# Patient Record
Sex: Female | Born: 1965 | Race: White | Hispanic: No | Marital: Married | Smoking: Never smoker
Health system: Southern US, Community
[De-identification: ages and names within clinical notes are randomized; demographics above are authoritative.]

## PROBLEM LIST (undated history)

## (undated) DIAGNOSIS — E041 Nontoxic single thyroid nodule: Secondary | ICD-10-CM

## (undated) HISTORY — PX: DILATION AND CURETTAGE OF UTERUS: SHX78

## (undated) HISTORY — DX: Nontoxic single thyroid nodule: E04.1

## (undated) HISTORY — PX: TUBAL LIGATION: SHX77

---

## 1998-01-15 ENCOUNTER — Other Ambulatory Visit: Admission: RE | Admit: 1998-01-15 | Discharge: 1998-01-15 | Payer: Self-pay | Admitting: Gynecology

## 1998-01-29 ENCOUNTER — Encounter: Admission: RE | Admit: 1998-01-29 | Discharge: 1998-04-29 | Payer: Self-pay | Admitting: Gynecology

## 1998-08-16 ENCOUNTER — Inpatient Hospital Stay (HOSPITAL_COMMUNITY): Admission: AD | Admit: 1998-08-16 | Discharge: 1998-08-18 | Payer: Self-pay | Admitting: Gynecology

## 1998-09-23 ENCOUNTER — Other Ambulatory Visit: Admission: RE | Admit: 1998-09-23 | Discharge: 1998-09-23 | Payer: Self-pay | Admitting: Gynecology

## 1999-03-04 ENCOUNTER — Other Ambulatory Visit: Admission: RE | Admit: 1999-03-04 | Discharge: 1999-03-04 | Payer: Self-pay | Admitting: Gynecology

## 1999-03-07 ENCOUNTER — Ambulatory Visit (HOSPITAL_COMMUNITY): Admission: RE | Admit: 1999-03-07 | Discharge: 1999-03-07 | Payer: Self-pay | Admitting: Gynecology

## 1999-03-07 ENCOUNTER — Encounter (INDEPENDENT_AMBULATORY_CARE_PROVIDER_SITE_OTHER): Payer: Self-pay

## 1999-11-11 ENCOUNTER — Other Ambulatory Visit: Admission: RE | Admit: 1999-11-11 | Discharge: 1999-11-11 | Payer: Self-pay | Admitting: Gynecology

## 1999-11-20 ENCOUNTER — Ambulatory Visit (HOSPITAL_COMMUNITY): Admission: RE | Admit: 1999-11-20 | Discharge: 1999-11-20 | Payer: Self-pay | Admitting: Gynecology

## 1999-11-20 ENCOUNTER — Encounter (INDEPENDENT_AMBULATORY_CARE_PROVIDER_SITE_OTHER): Payer: Self-pay | Admitting: Specialist

## 2000-03-26 ENCOUNTER — Encounter: Admission: RE | Admit: 2000-03-26 | Discharge: 2000-06-24 | Payer: Self-pay | Admitting: Gynecology

## 2000-10-08 ENCOUNTER — Inpatient Hospital Stay (HOSPITAL_COMMUNITY): Admission: AD | Admit: 2000-10-08 | Discharge: 2000-10-10 | Payer: Self-pay | Admitting: Gynecology

## 2000-10-08 ENCOUNTER — Encounter (INDEPENDENT_AMBULATORY_CARE_PROVIDER_SITE_OTHER): Payer: Self-pay

## 2000-10-23 ENCOUNTER — Inpatient Hospital Stay (HOSPITAL_COMMUNITY): Admission: AD | Admit: 2000-10-23 | Discharge: 2000-10-23 | Payer: Self-pay | Admitting: Gynecology

## 2000-10-24 ENCOUNTER — Inpatient Hospital Stay (HOSPITAL_COMMUNITY): Admission: AD | Admit: 2000-10-24 | Discharge: 2000-10-24 | Payer: Self-pay | Admitting: Gynecology

## 2000-12-03 ENCOUNTER — Other Ambulatory Visit: Admission: RE | Admit: 2000-12-03 | Discharge: 2000-12-03 | Payer: Self-pay | Admitting: Gynecology

## 2002-03-01 ENCOUNTER — Other Ambulatory Visit: Admission: RE | Admit: 2002-03-01 | Discharge: 2002-03-01 | Payer: Self-pay | Admitting: Gynecology

## 2003-07-04 ENCOUNTER — Other Ambulatory Visit: Admission: RE | Admit: 2003-07-04 | Discharge: 2003-07-04 | Payer: Self-pay | Admitting: Gynecology

## 2004-07-08 ENCOUNTER — Other Ambulatory Visit: Admission: RE | Admit: 2004-07-08 | Discharge: 2004-07-08 | Payer: Self-pay | Admitting: Gynecology

## 2006-06-01 ENCOUNTER — Other Ambulatory Visit: Admission: RE | Admit: 2006-06-01 | Discharge: 2006-06-01 | Payer: Self-pay | Admitting: Gynecology

## 2007-07-19 ENCOUNTER — Other Ambulatory Visit: Admission: RE | Admit: 2007-07-19 | Discharge: 2007-07-19 | Payer: Self-pay | Admitting: Gynecology

## 2008-04-05 ENCOUNTER — Ambulatory Visit: Payer: Self-pay | Admitting: Gynecology

## 2008-04-20 ENCOUNTER — Encounter: Admission: RE | Admit: 2008-04-20 | Discharge: 2008-04-20 | Payer: Self-pay | Admitting: Endocrinology

## 2008-05-03 ENCOUNTER — Other Ambulatory Visit: Admission: RE | Admit: 2008-05-03 | Discharge: 2008-05-03 | Payer: Self-pay | Admitting: Endocrinology

## 2008-08-02 ENCOUNTER — Ambulatory Visit: Payer: Self-pay | Admitting: Gynecology

## 2008-08-02 ENCOUNTER — Other Ambulatory Visit: Admission: RE | Admit: 2008-08-02 | Discharge: 2008-08-02 | Payer: Self-pay | Admitting: Gynecology

## 2008-08-02 ENCOUNTER — Encounter: Payer: Self-pay | Admitting: Gynecology

## 2009-08-13 ENCOUNTER — Ambulatory Visit: Payer: Self-pay | Admitting: Gynecology

## 2009-08-13 ENCOUNTER — Other Ambulatory Visit: Admission: RE | Admit: 2009-08-13 | Discharge: 2009-08-13 | Payer: Self-pay | Admitting: Gynecology

## 2010-08-14 ENCOUNTER — Encounter: Payer: Self-pay | Admitting: Gynecology

## 2010-08-28 ENCOUNTER — Encounter (INDEPENDENT_AMBULATORY_CARE_PROVIDER_SITE_OTHER): Payer: BC Managed Care – PPO | Admitting: Gynecology

## 2010-08-28 ENCOUNTER — Other Ambulatory Visit (HOSPITAL_COMMUNITY)
Admission: RE | Admit: 2010-08-28 | Discharge: 2010-08-28 | Disposition: A | Discharge status: Home / Self Care | Payer: BC Managed Care – PPO | Source: Ambulatory Visit | Attending: Gynecology | Admitting: Gynecology

## 2010-08-28 ENCOUNTER — Other Ambulatory Visit: Payer: Self-pay | Admitting: Gynecology

## 2010-08-28 DIAGNOSIS — Z124 Encounter for screening for malignant neoplasm of cervix: Secondary | ICD-10-CM | POA: Insufficient documentation

## 2010-08-28 DIAGNOSIS — Z01419 Encounter for gynecological examination (general) (routine) without abnormal findings: Secondary | ICD-10-CM

## 2010-11-07 NOTE — Op Note (Signed)
[facility]  Patient:    Angela Rios, Angela Rios                        MRN: [id number] Proc. Date: 10/08/00 Adm. Date:  [id number] Attending:  Merrily Pew                           Operative Report  PREOPERATIVE DIAGNOSES:       1. Pregnancy at term.                               2. Prior cesarean section x 2.  Desires repeat.                               3. Desires permanent sterilization.  POSTOPERATIVE DIAGNOSES:      1. Pregnancy at term.                               2. Prior cesarean section x 2.  Desires repeat.                               3. Desires permanent sterilization.  PROCEDURES:                   1. Repeat low transverse cervical cesarean                                  section.                               2. Bilateral tubal sterilization, modified                                  Pomeroy technique.  SURGEON:                      Timothy P. Fontaine, M.D.  ASSISTANTGaetano Hawthorne. Lily Peer, M.D.  ANESTHESIA:                   Regional.  ESTIMATED BLOOD LOSS:         Less than 500 cc.  COMPLICATIONS:                None.  SPECIMENS:                    1. Portions of right and portions of left                                  fallopian tube.                               2. Samples of cord blood.  FINDINGS:  At 73, normal female infant.  Apgars 9 and 9. Weight 8  lb 0 oz.  Pelvic anatomy was noted to be normal, noting a paper-thin lower uterine segment.  DESCRIPTION OF PROCEDURE:     The patient was taken to the operating room and underwent regional anesthesia.  She was placed in the left tilt supine position and received an abdominal preparation of Betadine scrub and Betadine solution.  A Foley catheter was placed with sterile technique by the nursing personnel.  The patient was draped in the usual fashion.  After assuring adequate anesthesia, the abdomen was sharply entered through a  repeat Pfannenstiel incision, achieving adequate hemostasis at all levels.  The bladder flap was then sharply developed and the lower uterine segment was noted to be paper thin with transillumination of the amniotic fluid, with no dissection of the lower uterine segment necessary.  The bulging membranes were ruptured and the fluid was noted to be clear.  The infants head was delivered through the incision.  The nares and mouth were suctioned.  The rest of the infant was delivered.  The cord was doubly clamped and cut and the infant handed to pediatrics in attendance.  Samples of cord blood were obtained.  The placenta was then spontaneously extruded and noted to be intact.  The uterus was then exteriorized and the endometrial cavity was explored with a sponge to remove all placental membrane fragments.  The bladder flap was then sharply developed to achieve more substantial lower uterine segment to reapproximate. Subsequently, the uterine incision was reapproximated using 0 Vicryl suture in a running interlocking stitch.  The left fallopian tube was then identified, traced from its insertion to its fimbriated end, and a mid tubal segment was doubly ligated using 0 plain suture.  The intervening segment was sharply excised.  Adequate hemostasis as well as tubal lumen was grossly identified. A similar procedure was then carried out on the other side.  The uterus was returned to the abdomen.  The abdomen was copiously irrigated, noting adequate hemostasis.  The anterior fascia was then reapproximated using 0 Vicryl suture in a running stitch starting at the angle and meeting in the middle.  The subcutaneous tissues were irrigated and adequate hemostasis achieved with electrocautery.  The skin was reapproximated with staples.  A sterile dressing was applied.  The patient was taken to the recovery room in good condition, having tolerated the procedure well. DD:  10/08/00 TD:  10/09/00 Job:  [id number]

## 2010-11-07 NOTE — Discharge Summary (Signed)
[facility]  Patient:    Angela Rios, Angela Rios                        MRN: [id number] Adm. Date:  [id number] Disc. Date: [id number] Attending:  Douglass Rivers Dictator:   Antony Contras, [facility]                           Discharge Summary  DISCHARGE DIAGNOSES:          1. Intrauterine pregnancy at term.                               2. History of previous cesarean section, desires                                  repeat.                               3. Undesired fertility.  PROCEDURES:                   Low cervical transverse cesarean section with delivery of viable infant.  Bilateral tubal sterilization.  HISTORY OF PRESENT ILLNESS:   The patient is a 45 year old, gravida 5, para 2-0-2-2, with an EDC of October 15, 2000 by sonogram.  Prenatal risk factors include a history of two previous cesarean section, history of gestational diabetes, beta strep carrier, and age 50 at delivery.  LABORATORY DATA:              Blood type O negative.  Antibody screen negative.  RPR, BSAG, HIV nonreactive.  Rubella immuned.  MSA of P-elevated Downs risk.  Amniocentesis was normal.  HOSPITAL COURSE:              The patient was admitted on October 08, 2000 for a low cervical transverse cesarean section and bilateral tubal sterilization performed by Dr. Nadyne Coombes. Fontaine and assisted by Dr. Gaetano Hawthorne. Lily Peer under spinal anesthesia.  The patient was delivered of an Apgar 28 of 38 female infant, weight 8 pounds.  Postoperative course showed the patient did develop some ecchymosis of her incision site.  CBC showed hematocrit was 29.5, hemoglobin 10, platelets 191,000.  She was also given RhoGAM since the patient was Rh positive.  Since she did have the abdominal ecchymosis, she was also started on some dicloxacillin 500 mg q.i.d. to cover staph of the skin.  She was able to be discharged on her second postoperative day in satisfactory condition.  DISPOSITION:                  The  patient is to follow up in a week to remove staples.  DISCHARGE MEDICATIONS:        Percocet for pain.  Prenatal vitamins and iron.  DISCHARGE INSTRUCTIONS:       Ice pack to abdomen b.i.d.  FOLLOW-UP:                    Follow up for six-week postpartum exam. DD:  11/22/00 TD:  11/23/00 Job: [id number]

## 2010-11-07 NOTE — H&P (Signed)
[facility]  Patient:    Angela Rios, Angela Rios                        MRN: [id number] Adm. Date:  [id number] Attending:  Merrily Pew                         History and Physical  PREOPERATIVE DIAGNOSIS:       Missed abortion.  HISTORY OF PRESENT ILLNESS:   A 45 year old G33, P2, AB1 female, with ultrasonic evidence for missed abortion at approximately six weeks, admitted for suction D&C.  PAST MEDICAL HISTORY:         Uncomplicated.  PAST SURGICAL HISTORY:        Includes cesarean section x 2, suction D&C x 1 for missed AB.  ALLERGIES:                    None.  CURRENT MEDICATIONS:          Vitamins.  REVIEW OF SYSTEMS:            Noncontributory.  SOCIAL HISTORY:               Noncontributory.  FAMILY HISTORY:               Noncontributory.  PHYSICAL EXAMINATION:  HEENT:                        Normal.  LUNGS:                        Clear.  CARDIAC:                      Regular rate without murmurs, rubs, or gallops.  ABDOMEN:                      Benign.  PELVIC:                       External BUS, vagina normal.  Cervix grossly normal.  Uterus 8-9 weeks size, nontender.  Adnexa without masses or tenderness.  ASSESSMENT:                   Missed AB, six week size gestational sac by ultrasonographic serial evaluation for suction D&C.  Risks, benefits, indications, and alternatives were discussed with the patient and her husband to include bleeding, transfusion, and infection, uterine perforation, damage to internal organs necessitating major exploratory and reparative surgeries, all which were understood and accepted.  The patients blood type is Rh-negative and she will receive RhoGAM after the procedure. DD:  11/20/99 TD:  11/20/99 Job: [id number]

## 2010-11-07 NOTE — H&P (Signed)
[facility]  Patient:    Angela Rios, Angela Rios                          MRN: [id number] Adm. Date:  10/08/00 Attending:  Marcial Pacas P. Fontaine, M.D.                         History and Physical  CHIEF COMPLAINT:              1. Pregnancy at term.                               2. History of prior cesarean section x 2.                                  Desires repeat cesarean section, bilateral                                  tubal sterilization.  HISTORY OF PRESENT ILLNESS:   A 45 year old G5, P2, AB 2 female at term gestation with history of two prior cesarean sections who desires repeat cesarean section and tubal sterilization after counseling for trial of labor. Prenatal course has been uncomplicated to date.  She does have a history of gestational diabetes with her prior pregnancy and monitored her glucoses during this pregnancy and always had normal fasting and two-hour postprandials.  She does have a history of a positive beta Strep carrier and she is Rh negative and received RhoGAM at 28 weeks.  PAST MEDICAL HISTORY:         None.  PAST SURGICAL HISTORY:        Dilation and evacuation x 2.  Cesarean section                               x 2.  ALLERGIES:                    None.  REVIEW OF SYSTEMS:            Noncontributory.  FAMILY HISTORY:               Noncontributory.  SOCIAL HISTORY:               Noncontributory.  PHYSICAL EXAMINATION:  VITAL SIGNS:                  Afebrile.  Vital signs are stable.  HEENT:                        Normal.  LUNGS:                        Clear.  CARDIAC:                      Regular rate.  No rubs, murmurs, or gallops.  ABDOMEN:                      Gravid uterus, positive fetal heart tones.  PELVIC:  Deferred.  ASSESSMENT:                   A 45 year old G5, P2, AB 2 female at term gestation, history of two prior cesarean sections, who desires repeat cesarean section and tubal  sterilization.  I reviewed the risks again with her to include the risks of wound complication requiring opening and draining of incisions and closure by secondary intention.  The risk of infection requiring prolonged antibiotics was also reviewed.  The risks of hemorrhage and assessing transfusion and the risks of transfusion were reviewed to include transfusion reaction, hepatitis A, mad cow disease, and other unknown entities; all of which was understood and accepted.  The risks of inadvertent injury to internal organs including bowel, bladder, ureters, vessels, and nerves necessitating major exploratory reparative surgeries and future reparative surgeries including ostomy formation was discussed with her.  The permanency of the tubal sterilization was discussed, as well as the potential for failure and she understands these risks. DD:  10/05/00 TD:  10/05/00 Job: [id number]

## 2010-11-07 NOTE — Op Note (Signed)
[facility]  Patient:    Angela Rios, Angela Rios                        MRN: [id number] Proc. Date: 11/20/99 Adm. Date:  [id number] Disc. Date: [id number] Attending:  Merrily Pew                           Operative Report  PREOPERATIVE DIAGNOSIS:       Missed abortion.  POSTOPERATIVE DIAGNOSIS:      Missed abortion.  OPERATION:                    Suction dilation and curettage.  SURGEON:                      Timothy P. Fontaine, M.D.  MATERIALS:                    Materials forwarded to laboratory for examination were products of conception.  ESTIMATED BLOOD LOSS:         Minimal.  COMPLICATIONS:                None.  ANESTHESIA:                   MAC, 1% lidocaine pericervical block.  FINDINGS:                     Approximately 9-10 weeks size, soft, adnexa without masses, gross POC retrieved.  DESCRIPTION OF PROCEDURE:     The patient underwent IV sedation, placed in a dorsal lithotomy position, and received a perineal vaginal preparation with Betadine solution.  The bladder was emptied with in-and-out Foley catheterization by the nursing personnel and the patient was draped in the usual fashion.  The EUA was performed.  Cervix was visualized with a speculum. Anterior lip of the cervix was grasped with a single-tooth tenaculum and a paracervical block using 1% lidocaine was placed for a total of 10 cc.  The cervix was then gently dilated to admit the #8 suction curet and the suction curettage was performed without difficulty and with retrieval of gross POC. Gently sharp curettage was performed to assure complete emptying.  The tenaculum was removed.  Adequate hemostasis visualized.  The patient was then placed in the supine position and taken to the recovery room in good condition having tolerated the procedure well.  The patients blood type is Rh-negative and she will receive RhoGAM in the recovery room. DD:  11/20/99 TD:  11/24/99 Job:  [id number]

## 2011-07-03 ENCOUNTER — Encounter: Payer: Self-pay | Admitting: Gynecology

## 2011-09-17 ENCOUNTER — Encounter: Payer: Self-pay | Admitting: *Deleted

## 2011-09-21 ENCOUNTER — Ambulatory Visit (INDEPENDENT_AMBULATORY_CARE_PROVIDER_SITE_OTHER): Payer: BC Managed Care – PPO | Admitting: Gynecology

## 2011-09-21 ENCOUNTER — Encounter: Payer: Self-pay | Admitting: Gynecology

## 2011-09-21 VITALS — BP 120/74 | Ht 63.0 in | Wt 141.0 lb

## 2011-09-21 DIAGNOSIS — E041 Nontoxic single thyroid nodule: Secondary | ICD-10-CM | POA: Insufficient documentation

## 2011-09-21 DIAGNOSIS — Z01419 Encounter for gynecological examination (general) (routine) without abnormal findings: Secondary | ICD-10-CM

## 2011-09-21 NOTE — Patient Instructions (Signed)
Follow up in one year for annual gynecologic exam. 

## 2011-09-21 NOTE — Progress Notes (Signed)
Angela Rios August 29, 1965 [id number]        46 y.o.  for annual exam.  Doing well no complaints.  Past medical history,surgical history, medications, allergies, family history and social history were all reviewed and documented in the EPIC chart. ROS:  Was performed and pertinent positives and negatives are included in the history.  Exam: Kim chaperone present Filed Vitals:   09/21/11 1201  BP: 120/74   General appearance  Normal Skin grossly normal Head/Neck normal with no cervical or supraclavicular adenopathy thyroid normal Lungs  clear Cardiac RR, without RMG Abdominal  soft, nontender, without masses, organomegaly or hernia Breasts  examined lying and sitting without masses, retractions, discharge or axillary adenopathy. Pelvic  Ext/BUS/vagina  normal   Cervix  normal    Uterus  anteverted, normal size, shape and contour, midline and mobile nontender   Adnexa  Without masses or tenderness    Anus and perineum  normal   Rectovaginal  normal sphincter tone without palpated masses or tenderness.    Assessment/Plan:  46 y.o. female for annual exam.   Regular menses, tubal sterilization. 1. Pap smear. No Pap smear was done today. Her last Pap smear was 2012. She has no history of abnormal Pap smears before with numerous normal reports in her chart. I discussed current screening guidelines we'll plan on every three-year Pap smears. 2. Mammography. Patient had her mammogram 05/2011. She'll continue with annual mammography. SBE monthly reviewed. 3. Contraception, using BTL. 4. Health maintenance. Sees Dr. Horald Pollen for routine health maintenance and follow up of her diabetes for which she is on the insulin pump. No blood work was done today as is all done through her office. Assuming patient continues well from a gynecologic standpoint she will see me in a year or sooner as needed    Dara Lords MD, 12:24 PM 09/21/2011

## 2012-01-20 ENCOUNTER — Other Ambulatory Visit: Payer: Self-pay | Admitting: Endocrinology

## 2012-01-20 DIAGNOSIS — E049 Nontoxic goiter, unspecified: Secondary | ICD-10-CM

## 2012-01-28 ENCOUNTER — Ambulatory Visit
Admission: RE | Admit: 2012-01-28 | Discharge: 2012-01-28 | Disposition: A | Discharge status: Home / Self Care | Payer: BC Managed Care – PPO | Source: Ambulatory Visit | Attending: Endocrinology | Admitting: Endocrinology

## 2012-01-28 DIAGNOSIS — E049 Nontoxic goiter, unspecified: Secondary | ICD-10-CM

## 2012-07-08 ENCOUNTER — Encounter: Payer: Self-pay | Admitting: Gynecology

## 2012-10-31 ENCOUNTER — Ambulatory Visit (INDEPENDENT_AMBULATORY_CARE_PROVIDER_SITE_OTHER): Payer: BC Managed Care – PPO | Admitting: Gynecology

## 2012-10-31 ENCOUNTER — Encounter: Payer: Self-pay | Admitting: Gynecology

## 2012-10-31 VITALS — BP 120/70 | Ht 63.0 in | Wt 145.0 lb

## 2012-10-31 DIAGNOSIS — R21 Rash and other nonspecific skin eruption: Secondary | ICD-10-CM

## 2012-10-31 DIAGNOSIS — Z01419 Encounter for gynecological examination (general) (routine) without abnormal findings: Secondary | ICD-10-CM

## 2012-10-31 MED ORDER — NYSTATIN-TRIAMCINOLONE 100000-0.1 UNIT/GM-% EX OINT: TOPICAL_OINTMENT | Freq: Two times a day (BID) | CUTANEOUS | Status: DC

## 2012-10-31 NOTE — Patient Instructions (Signed)
Followup in one year for annual exam. Sooner if any issues. 

## 2012-10-31 NOTE — Progress Notes (Signed)
Angela Rios 11-18-1965 [id number]        47 y.o.  [id number] for annual exam.  Doing well without complaints.  Past medical history,surgical history, medications, allergies, family history and social history were all reviewed and documented in the EPIC chart. ROS:  Was performed and pertinent positives and negatives are included in the history.  Exam: Kim assistant Filed Vitals:   10/31/12 1230  BP: 120/70  Height: 5\' 3"  (1.6 m)  Weight: 145 lb (65.772 kg)   General appearance  Normal Skin grossly normal Head/Neck normal with no cervical or supraclavicular adenopathy thyroid normal Lungs  clear Cardiac RR, without RMG Abdominal  soft, nontender, without masses, organomegaly or hernia. Classic yeast rash in her panniculus fold Breasts  examined lying and sitting without masses, retractions, discharge or axillary adenopathy. Classic yeast rash under both breasts Pelvic  Ext/BUS/vagina  normal   Cervix  normal   Uterus  anteverted, normal size, shape and contour, midline and mobile nontender   Adnexa  Without masses or tenderness    Anus and perineum  normal   Rectovaginal  normal sphincter tone without palpated masses or tenderness.    Assessment/Plan:  47 y.o. [id number] female for annual exam, regular menses, tubal sterilization.  1. Skin rash under both breasts and panniculus fold consistent with yeast. Will treat with Mytrex twice a day as needed. Prescription written for one tube with 2 refills. 2. Mammography 06/2012. Continue again on mammography. 3. Pap smear 2012. No Pap smear done today. No history of abnormal Pap smears. Plan repeat Pap smear next year at 3 year interval. 4. History of diabetes and thyroid nodule. Actively being followed by Dr. Talmage Nap. No lab work done today as is all done through their office. Followup one year, sooner as needed.    Dara Lords MD, 1:03 PM 10/31/2012

## 2013-07-10 ENCOUNTER — Encounter: Payer: Self-pay | Admitting: Gynecology

## 2013-07-24 ENCOUNTER — Other Ambulatory Visit: Payer: Self-pay | Admitting: Endocrinology

## 2013-07-24 DIAGNOSIS — E049 Nontoxic goiter, unspecified: Secondary | ICD-10-CM

## 2014-01-11 ENCOUNTER — Ambulatory Visit (INDEPENDENT_AMBULATORY_CARE_PROVIDER_SITE_OTHER): Payer: PRIVATE HEALTH INSURANCE | Admitting: Gynecology

## 2014-01-11 ENCOUNTER — Encounter: Payer: Self-pay | Admitting: Gynecology

## 2014-01-11 ENCOUNTER — Other Ambulatory Visit (HOSPITAL_COMMUNITY)
Admission: RE | Admit: 2014-01-11 | Discharge: 2014-01-11 | Disposition: A | Discharge status: Home / Self Care | Payer: PRIVATE HEALTH INSURANCE | Source: Ambulatory Visit | Attending: Gynecology | Admitting: Gynecology

## 2014-01-11 VITALS — BP 108/70 | Ht 63.0 in | Wt 144.8 lb

## 2014-01-11 DIAGNOSIS — Z1151 Encounter for screening for human papillomavirus (HPV): Secondary | ICD-10-CM | POA: Insufficient documentation

## 2014-01-11 DIAGNOSIS — Z01419 Encounter for gynecological examination (general) (routine) without abnormal findings: Secondary | ICD-10-CM | POA: Insufficient documentation

## 2014-01-11 DIAGNOSIS — R21 Rash and other nonspecific skin eruption: Secondary | ICD-10-CM

## 2014-01-11 NOTE — Progress Notes (Signed)
Angela MedicusDiana R Rios 02/10/1966 [id number]        48 y.o.  [id number] for annual exam.  Several issues noted below.  Past medical history,surgical history, problem list, medications, allergies, family history and social history were all reviewed and documented as reviewed in the EPIC chart.  ROS:  12 system ROS performed with pertinent positives and negatives included in the history, assessment and plan.   Additional significant findings :  None   Exam: Angela RistKari assistant Filed Vitals:   01/11/14 1214  BP: 108/70  Height: 5\' 3"  (1.6 m)  Weight: 144 lb 12.8 oz (65.681 kg)   General appearance:  Normal affect, orientation and appearance. Skin: Rash within the panniculus consistent with fungal HEENT: Without gross lesions.  No cervical or supraclavicular adenopathy. Thyroid prominent with no definitive nodules or masses.  Lungs:  Clear without wheezing, rales or rhonchi Cardiac: RR, without RMG Abdominal:  Soft, nontender, without masses, guarding, rebound, organomegaly or hernia Breasts:  Examined lying and sitting without masses, retractions, discharge or axillary adenopathy. Pelvic:  Ext/BUS/vagina normal  Cervix normal, Pap/HPV  Uterus anteverted, normal size, shape and contour, midline and mobile nontender   Adnexa  Without masses or tenderness    Anus and perineum  Normal   Rectovaginal  Normal sphincter tone without palpated masses or tenderness.    Assessment/Plan:  48 y.o. [id number] female for annual exam irregular menses, tubal sterilization.   1. Skin rash. Patient has a classic fungal rash in her panniculus. Offered antifungal to treat the patient states does not bother her and she really just prefers to monitor. It's worse in the summer when she is exercising. She will call if she changes her mind for medication. 2. Prominent thyroid. She does have a history of thyroid nodules and is actively being followed by Angela Rios. She has a planned upcoming appointment with her and will followup  with her. 3. Pap smear 2012. Pap/HPV today. No history of abnormal Pap smears previously. Plan repeat at 3-5 year interval assuming this Pap smear is normal per current screening guidelines. 4. Mammography 06/2013. Continue with annual mammography. SBE monthly review. 5. Health maintenance. No blood work done as she has this all done through Dr. Willeen Rios office. Followup one year, sooner as needed.   Note: This document was prepared with digital dictation and possible smart phrase technology. Any transcriptional errors that result from this process are unintentional.   Dara LordsFONTAINE,Lashanta Elbe P MD, 12:34 PM 01/11/2014

## 2014-01-11 NOTE — Patient Instructions (Signed)
Followup in one year for annual exam, sooner as needed. 

## 2014-01-11 NOTE — Addendum Note (Signed)
Addended by: Richardson ChiquitoWILKINSON, [name] S on: 01/11/2014 05:15 PM   Modules accepted: Orders

## 2014-01-15 LAB — CYTOLOGY - PAP

## 2014-04-23 ENCOUNTER — Encounter: Payer: Self-pay | Admitting: Gynecology

## 2014-07-19 ENCOUNTER — Encounter: Payer: Self-pay | Admitting: Gynecology

## 2014-07-24 ENCOUNTER — Ambulatory Visit
Admission: RE | Admit: 2014-07-24 | Discharge: 2014-07-24 | Disposition: A | Discharge status: Home / Self Care | Payer: PRIVATE HEALTH INSURANCE | Source: Ambulatory Visit | Attending: Endocrinology | Admitting: Endocrinology

## 2014-07-24 DIAGNOSIS — E049 Nontoxic goiter, unspecified: Secondary | ICD-10-CM

## 2015-02-18 ENCOUNTER — Other Ambulatory Visit: Payer: Self-pay | Admitting: Endocrinology

## 2015-02-18 DIAGNOSIS — E049 Nontoxic goiter, unspecified: Secondary | ICD-10-CM

## 2015-07-18 ENCOUNTER — Encounter: Payer: PRIVATE HEALTH INSURANCE | Admitting: Gynecology

## 2015-07-25 ENCOUNTER — Encounter: Payer: Self-pay | Admitting: Gynecology

## 2015-07-26 ENCOUNTER — Encounter: Payer: Self-pay | Admitting: Gynecology

## 2015-07-31 ENCOUNTER — Ambulatory Visit
Admission: RE | Admit: 2015-07-31 | Discharge: 2015-07-31 | Disposition: A | Discharge status: Home / Self Care | Payer: 59 | Source: Ambulatory Visit | Attending: Endocrinology | Admitting: Endocrinology

## 2015-07-31 DIAGNOSIS — E049 Nontoxic goiter, unspecified: Secondary | ICD-10-CM

## 2015-08-29 ENCOUNTER — Ambulatory Visit (INDEPENDENT_AMBULATORY_CARE_PROVIDER_SITE_OTHER): Payer: 59 | Admitting: Gynecology

## 2015-08-29 ENCOUNTER — Encounter: Payer: Self-pay | Admitting: Gynecology

## 2015-08-29 VITALS — BP 122/74 | Ht 63.0 in | Wt 154.0 lb

## 2015-08-29 DIAGNOSIS — Z01419 Encounter for gynecological examination (general) (routine) without abnormal findings: Secondary | ICD-10-CM | POA: Diagnosis not present

## 2015-08-29 DIAGNOSIS — N926 Irregular menstruation, unspecified: Secondary | ICD-10-CM | POA: Diagnosis not present

## 2015-08-29 LAB — FOLLICLE STIMULATING HORMONE: FSH: 26.4 m[IU]/mL

## 2015-08-29 NOTE — Progress Notes (Signed)
    Angela MedicusDiana R Rios 11/25/1965 [id number]        50 y.o.  [id number]  for annual exam.  Doing well.  Past medical history,surgical history, problem list, medications, allergies, family history and social history were all reviewed and documented as reviewed in the EPIC chart.  ROS:  Performed with pertinent positives and negatives included in the history, assessment and plan.   Additional significant findings :  none   Exam: Angela PortelaKim Rios assistant Filed Vitals:   08/29/15 1138  BP: 122/74  Height: 5\' 3"  (1.6 m)  Weight: 154 lb (69.854 kg)   General appearance:  Normal affect, orientation and appearance. Skin: Grossly normal HEENT: Without gross lesions.  No cervical or supraclavicular adenopathy. Thyroid prominent.  Lungs:  Clear without wheezing, rales or rhonchi Cardiac: RR, without RMG Abdominal:  Soft, nontender, without masses, guarding, rebound, organomegaly or hernia Breasts:  Examined lying and sitting without masses, retractions, discharge or axillary adenopathy. Pelvic:  Ext/BUS/vagina normal  Cervix normal  Uterus anteverted, normal size, shape and contour, midline and mobile nontender   Adnexa without masses or tenderness    Anus and perineum normal   Rectovaginal normal sphincter tone without palpated masses or tenderness.    Assessment/Plan:  50 y.o. [id number] female for annual exam with irregular menses, tubal sterilization.   1. Menses have become more irregular this past year with skips up to 4 and 5 months. Having some hot flashes and sweats. Will check baseline FSH. If elevated them plan expectant management with menstrual calendar. If regular menses when occur then followed. If prolonged or atypical bleeding or menopausal symptoms worsen then follow up for further evaluation and treatment. If FSH is normal I discussed the need for more regular endometrial shedding and possible intermittent progesterone withdrawals. 2. Pap smear/HPV 12/2013 negative. No history of  significant abnormal Pap smears. 3. Mammography 07/2015. Continue with annual mammography when due. SBE monthly reviewed. 4. History of goiter. Being followed by Dr. Talmage NapBalan with history of recent ultrasound. Continue to follow up with her in reference to this. 5. Health maintenance. No routine blood work done as this is all done through Dr. Willeen CassBalan's office. Follow up 1 year, sooner as needed.   Dara LordsFONTAINE,[name] MD, 12:20 PM 08/29/2015

## 2015-08-29 NOTE — Patient Instructions (Signed)

## 2015-09-16 IMAGING — US US SOFT TISSUE HEAD/NECK
1 series · 13 of 25 positions shown · non-contrast
Comparison: 01/28/2012 and 04/20/2008

CLINICAL DATA: Followup of multinodular thyroid goiter.

EXAM:
THYROID ULTRASOUND
TECHNIQUE: Ultrasound examination of the thyroid gland and adjacent soft
tissues was performed.

[Series 1: us soft tissue head/neck · 0.11mm/px · 13 of 65 slices shown]
[im 1/65]
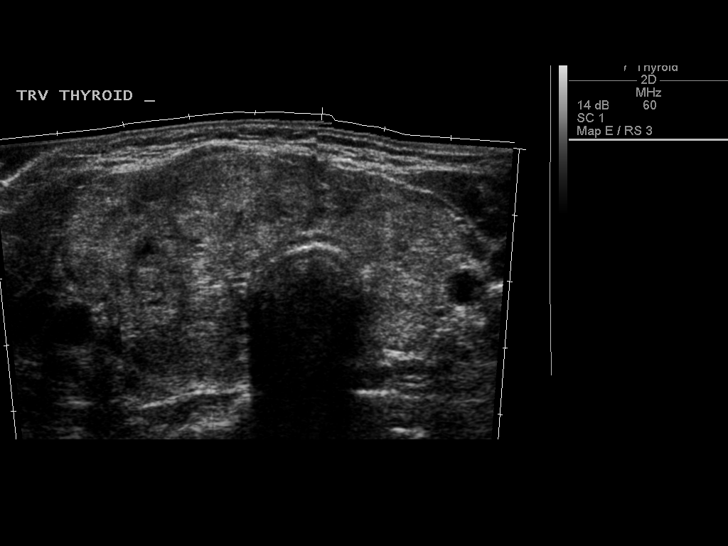
[im 6/65]
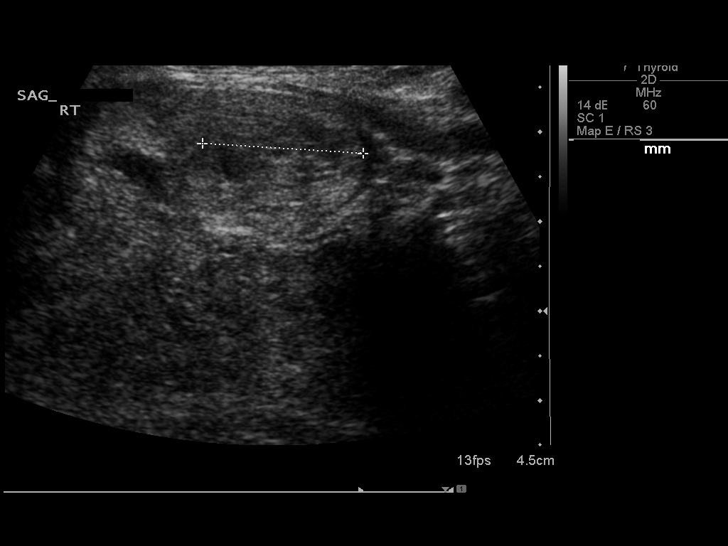
[im 11/65]
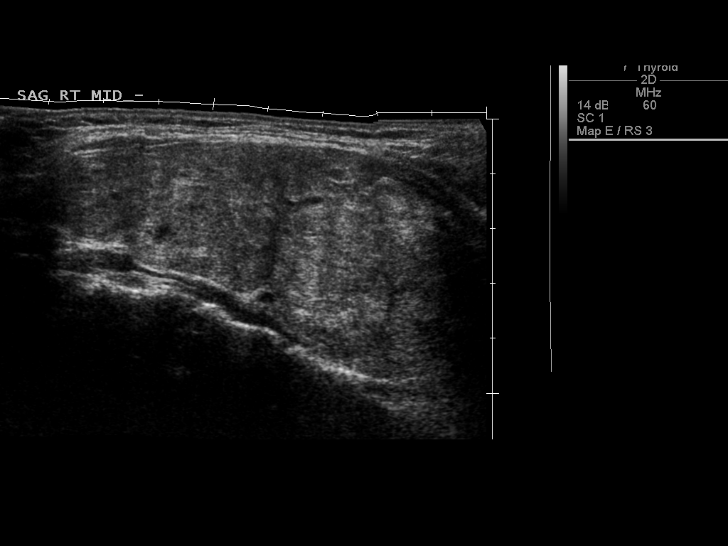
[im 17/65]
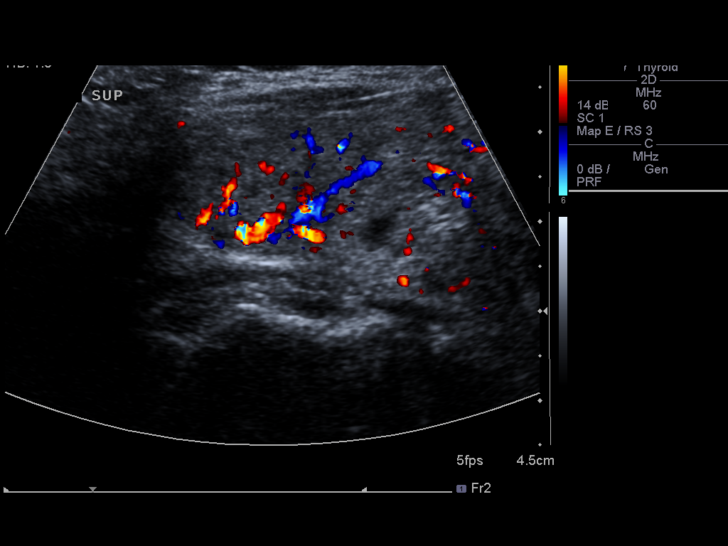
[im 22/65]
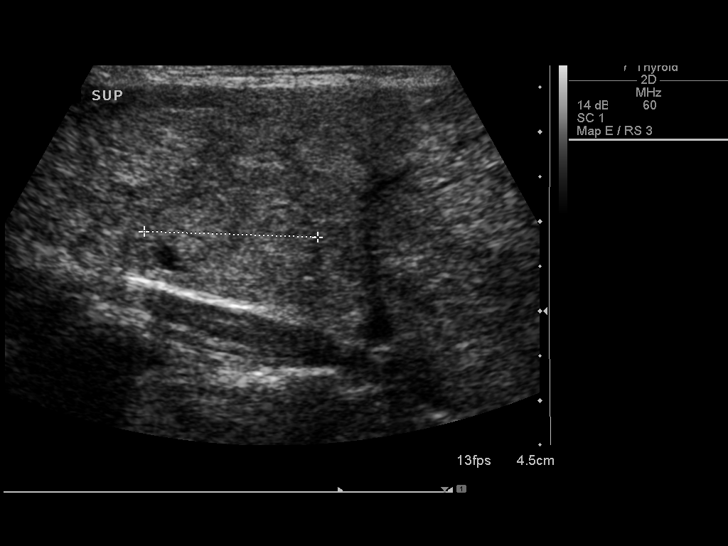
[im 27/65]
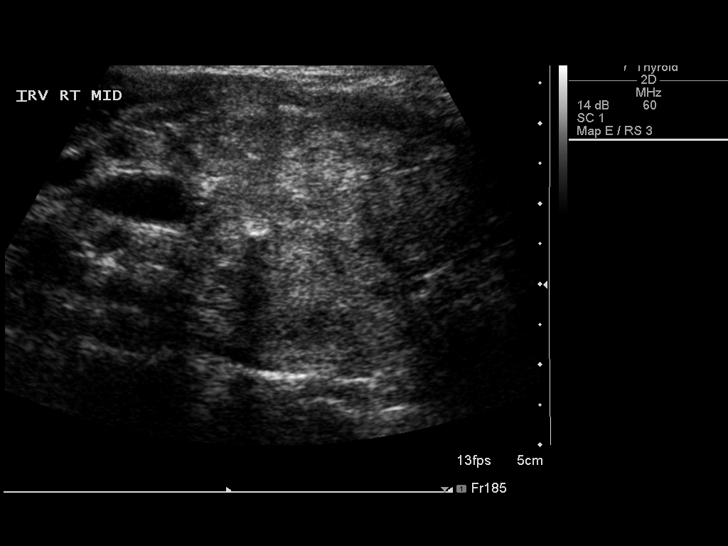
[im 33/65]
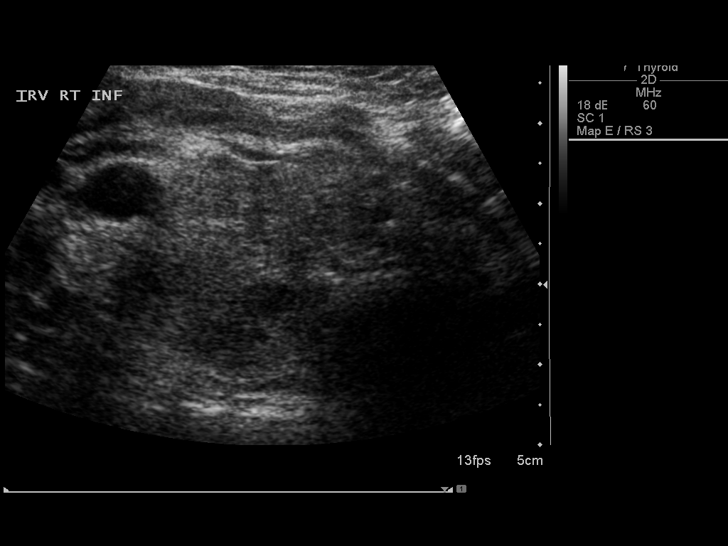
[im 38/65]
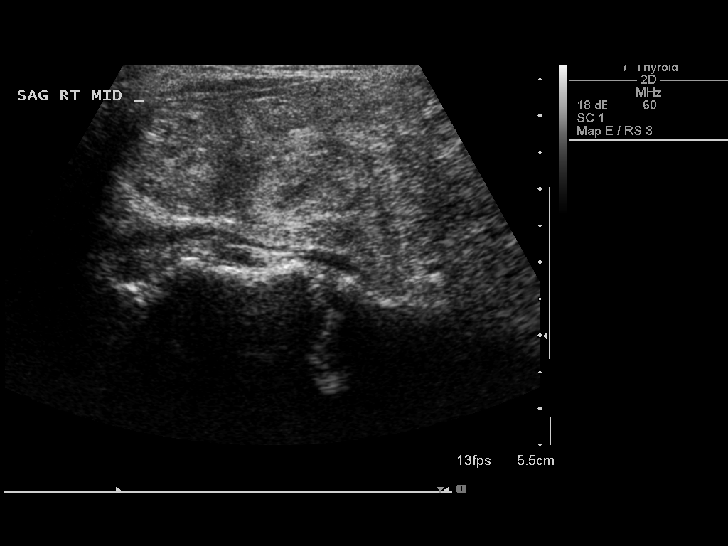
[im 43/65]
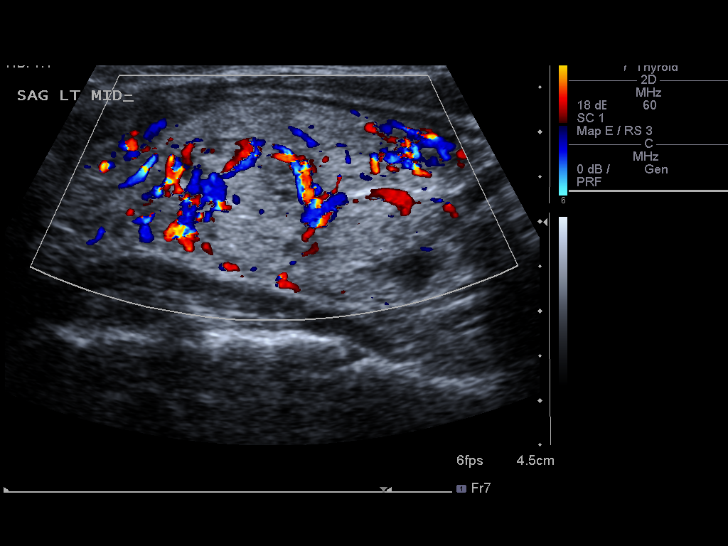
[im 49/65]
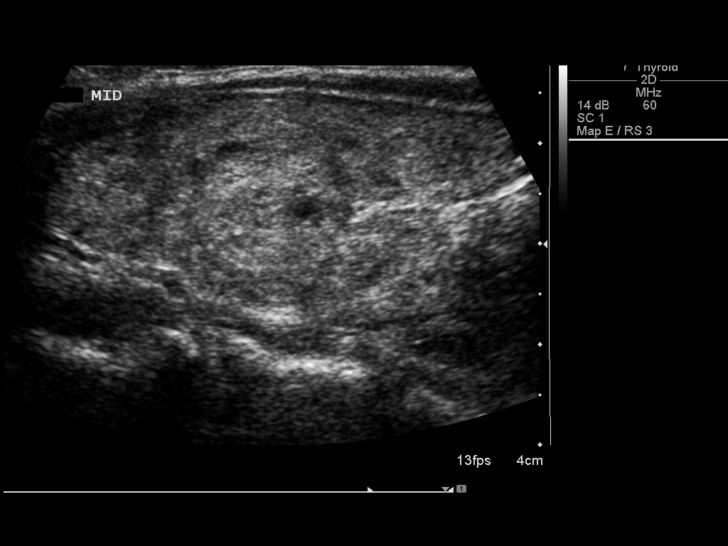
[im 54/65]
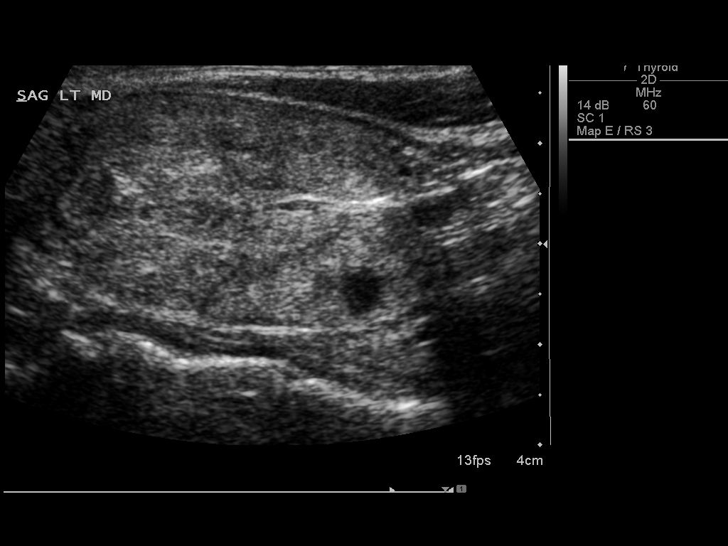
[im 59/65]
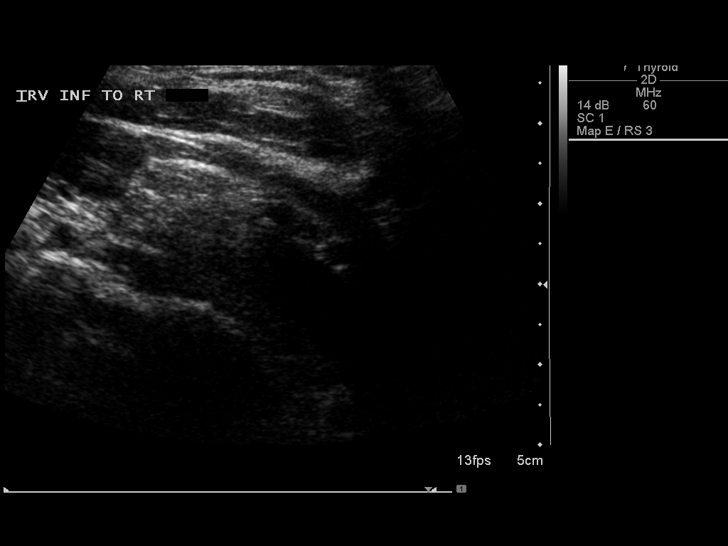
[im 65/65]
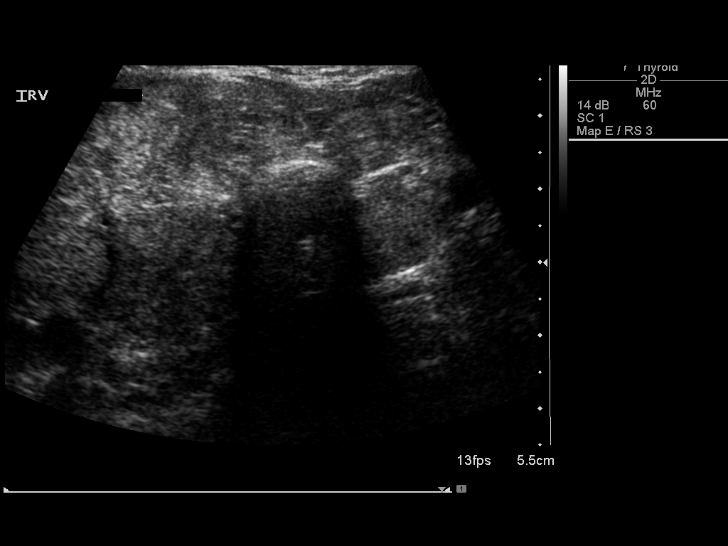

[13 of 25 positions shown; findings below may reference images not displayed]

FINDINGS: Right thyroid lobe

Measurements: 8.7 x 3.9 x 3.8 cm (7.9 x 3.7 x 3.0 cm previously).
Stable dominant solid area of nodularity in the mid to lower right
lobe measures approximately 2.5 x 3.0 x 2.5 cm. This region measured
approximately 3.3 cm on the prior study. Medial right thyroid nodule
near juncture with isthmus seen previously is no longer discretely
delineated. In this region, an area of vague nodularity is present
measuring approximately 1.8 cm in maximum diameter. Previous
abnormality was estimated to be up to 3.8 cm in diameter.

Left thyroid lobe

Measurements: 5.3 x 2.2 x 2.3 cm (4.8 x 2.2 x 2.1 cm previously).
Solid nodule in the left lobe measures approximately 1.7 x 1.6 x
cm.

Isthmus

Thickness: 1.3 cm. Heterogeneous thickening of the thyroid isthmus
noted without a discrete nodule identified.

Lymphadenopathy

None visualized.
IMPRESSION: Multinodular goiter with slight overall enlargement of both lobes
since the prior study. The right lobe remains larger than the left.
Multiple ill-defined areas of nodularity are again noted
bilaterally. One area previously seen in the medial right lobe near
the isthmus is less discretely delineated. No definite new nodules
are identified.

## 2016-07-27 ENCOUNTER — Encounter: Payer: Self-pay | Admitting: Gynecology

## 2016-09-14 ENCOUNTER — Ambulatory Visit (INDEPENDENT_AMBULATORY_CARE_PROVIDER_SITE_OTHER): Payer: 59 | Admitting: Gynecology

## 2016-09-14 ENCOUNTER — Encounter: Payer: Self-pay | Admitting: Gynecology

## 2016-09-14 VITALS — BP 130/80 | Ht 63.0 in | Wt 160.0 lb

## 2016-09-14 DIAGNOSIS — N951 Menopausal and female climacteric states: Secondary | ICD-10-CM

## 2016-09-14 DIAGNOSIS — Z01419 Encounter for gynecological examination (general) (routine) without abnormal findings: Secondary | ICD-10-CM

## 2016-09-14 NOTE — Patient Instructions (Signed)

## 2016-09-14 NOTE — Progress Notes (Signed)
    Brigitte PulseDiana R Zalewski 10/16/1965 [id number]        51 y.o.  [id number] for annual exam.    Past medical history,surgical history, problem list, medications, allergies, family history and social history were all reviewed and documented as reviewed in the EPIC chart.  ROS:  Performed with pertinent positives and negatives included in the history, assessment and plan.   Additional significant findings :  None   Exam: Biomedical scientistBlanca assistant Vitals:   09/14/16 1157  BP: 130/80  Weight: 160 lb (72.6 kg)  Height: 5\' 3"  (1.6 m)   Body mass index is 28.34 kg/m.  General appearance:  Normal affect, orientation and appearance. Skin: Grossly normal HEENT: Without gross lesions.  No cervical or supraclavicular adenopathy. Thyroid prominent with no masses palpated.  Lungs:  Clear without wheezing, rales or rhonchi Cardiac: RR, without RMG Abdominal:  Soft, nontender, without masses, guarding, rebound, organomegaly or hernia Breasts:  Examined lying and sitting without masses, retractions, discharge or axillary adenopathy. Pelvic:  Ext, BUS, Vagina: Normal with mild atrophic changes  Cervix: Normal with mild atrophic changes  Uterus: Anteverted, normal size, shape and contour, midline and mobile nontender   Adnexa: Without masses or tenderness    Anus and perineum: Normal   Rectovaginal: Normal sphincter tone without palpated masses or tenderness.    Assessment/Plan:  51 y.o. [id number] female for annual exam.   1. Postmenopausal/menopausal symptoms. Last menstrual period about a year ago. FSH elevated last year. Having some hot flushes and sweats. Reviewed options to include OTC products as well as HRT. Patient not interested in HRT at this time. Risks to include stroke heart attack DVT breast cancer issues reviewed. We'll try OTC products. Will follow up if symptoms worsen and wants to rediscuss HRT. Patient knows to call if any bleeding. 2. Pap smear/HPV 12/2013. No Pap smear done today. No history of  significant abnormal Pap smears. Plan repeat Pap smear approaching 5 year interval per current screening guidelines. 3. Colonoscopy never. Patient plans on scheduling. 4. Mammography 2018. Continue annual mammography next year. SBE monthly reviewed. 5. Health maintenance. No routine lab work done as patient does this at Dr. Willeen CassBalan's office. Follow up in one year, sooner as needed.   Dara LordsFONTAINE,TIMOTHY P MD, 12:13 PM 09/14/2016

## 2016-09-22 IMAGING — US US SOFT TISSUE HEAD/NECK
1 series · 14 of 25 positions shown · non-contrast
Comparison: 07/24/2014, 01/28/2012 and 04/20/2008.

CLINICAL DATA: Multinodular thyroid goiter.

EXAM:
THYROID ULTRASOUND
TECHNIQUE: Ultrasound examination of the thyroid gland and adjacent soft
tissues was performed.

[Series 1: us soft tissue head/neck · 0.08mm/px · 14 of 43 slices shown]
[im 1/43]
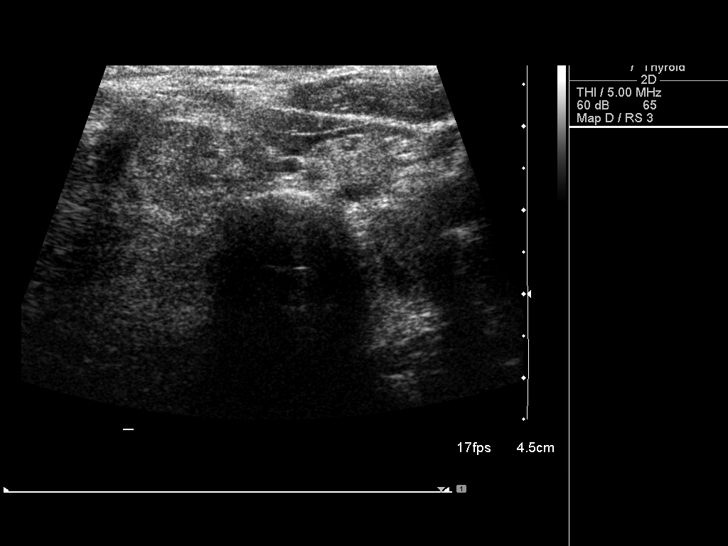
[im 4/43]
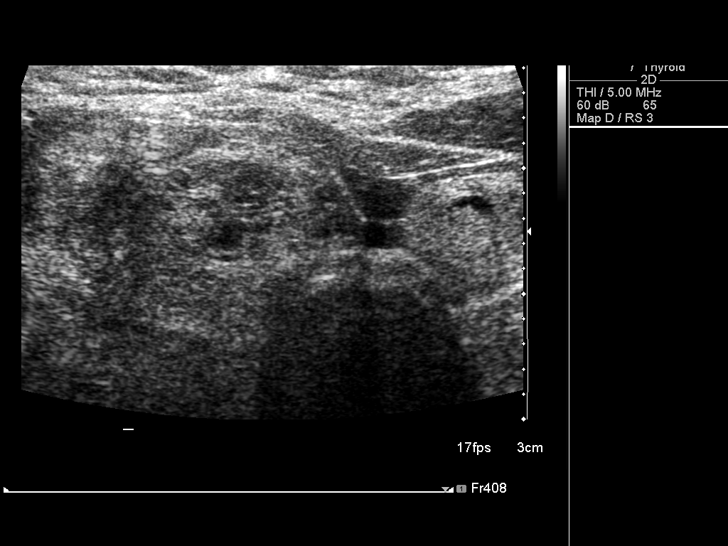
[im 8/43]
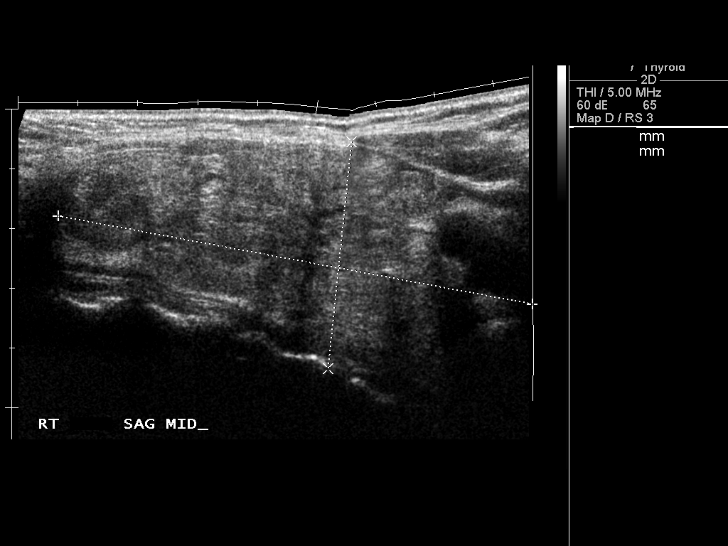
[im 11/43]
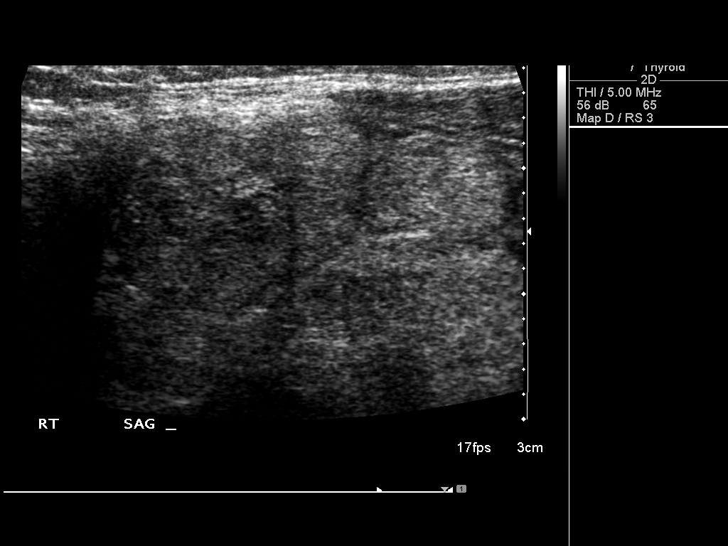
[im 15/43]
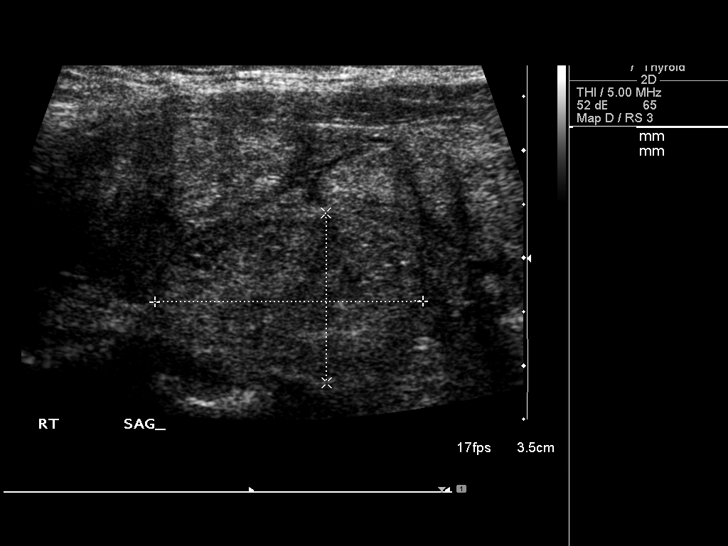
[im 16/43]
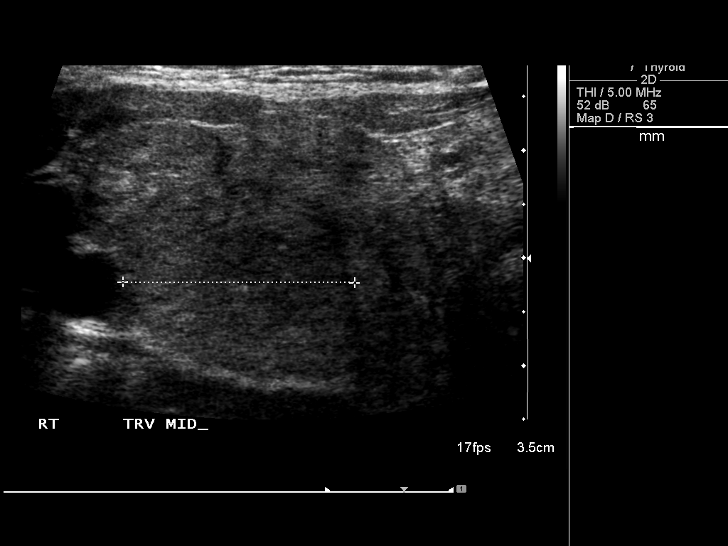
[im 20/43]
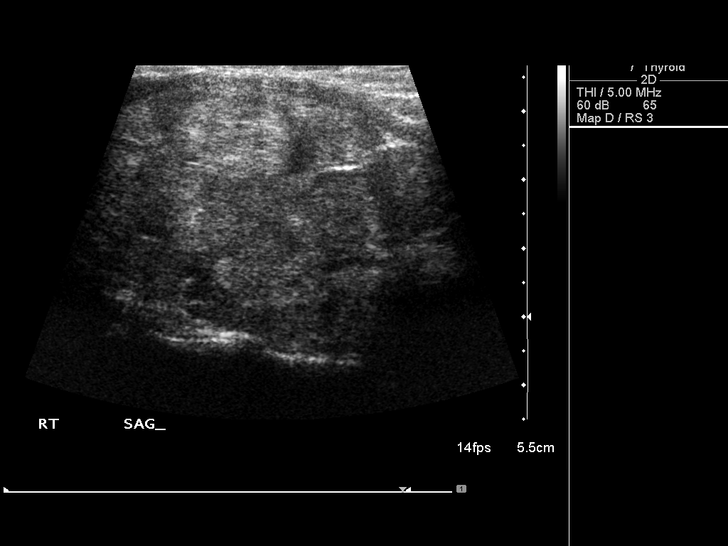
[im 23/43]
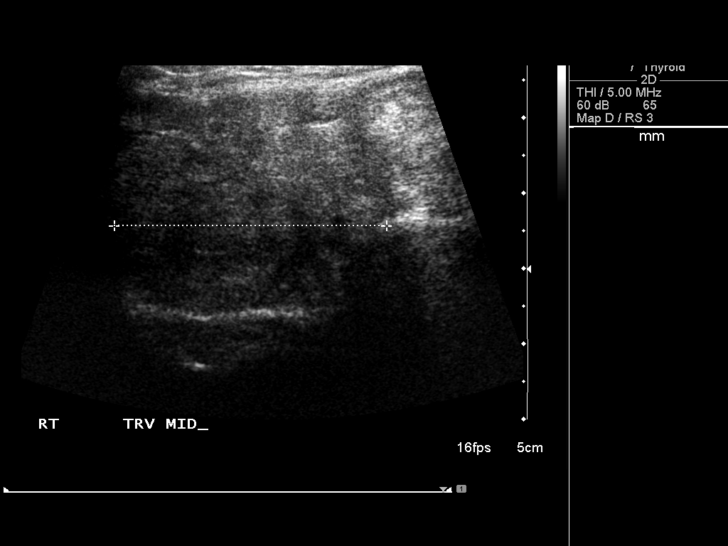
[im 27/43]
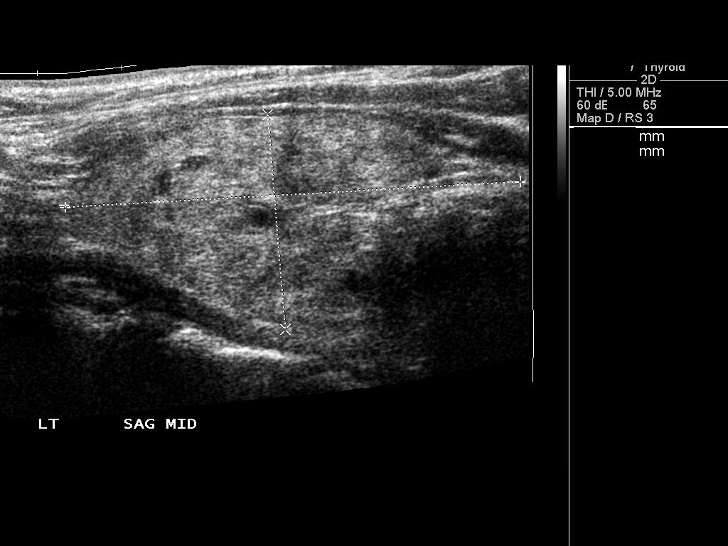
[im 29/43]
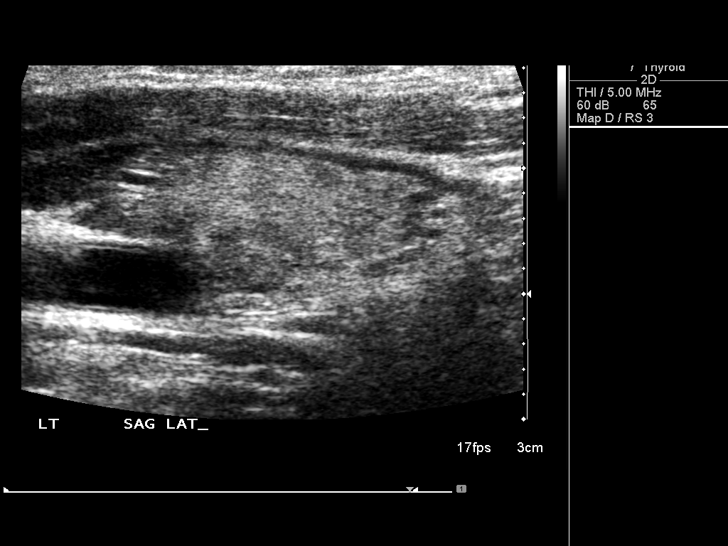
[im 32/43]
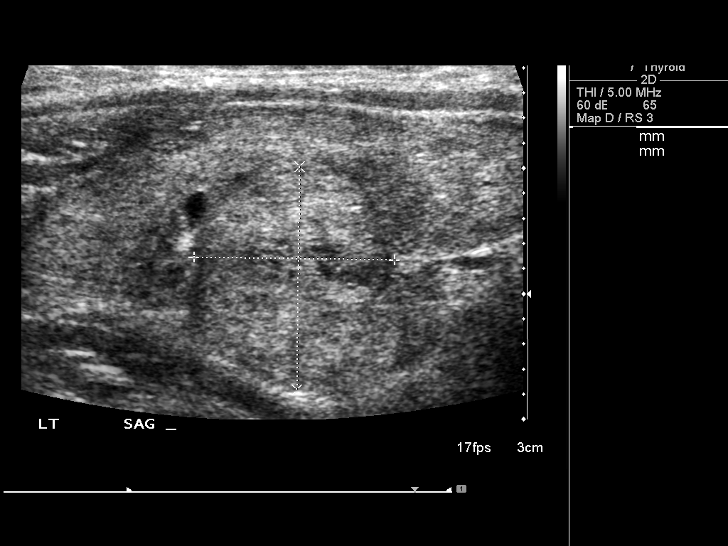
[im 36/43]
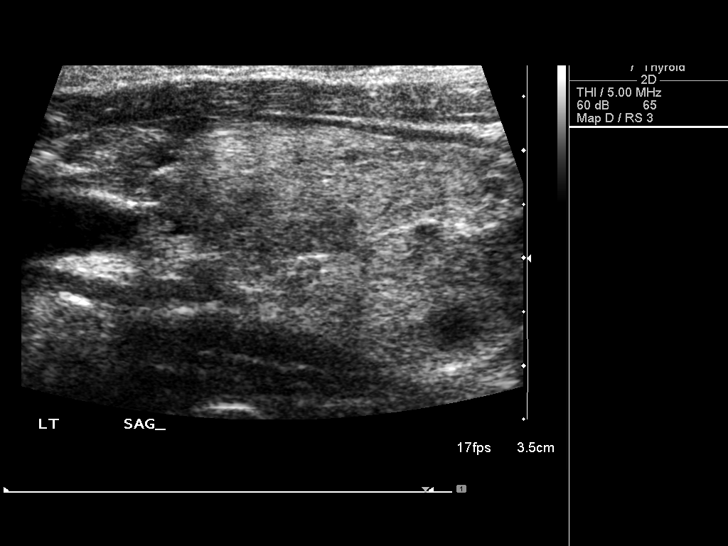
[im 39/43]
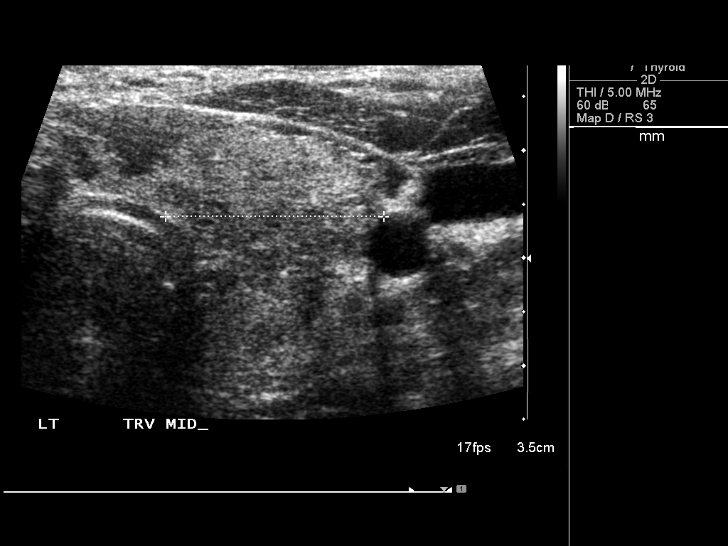
[im 43/43]
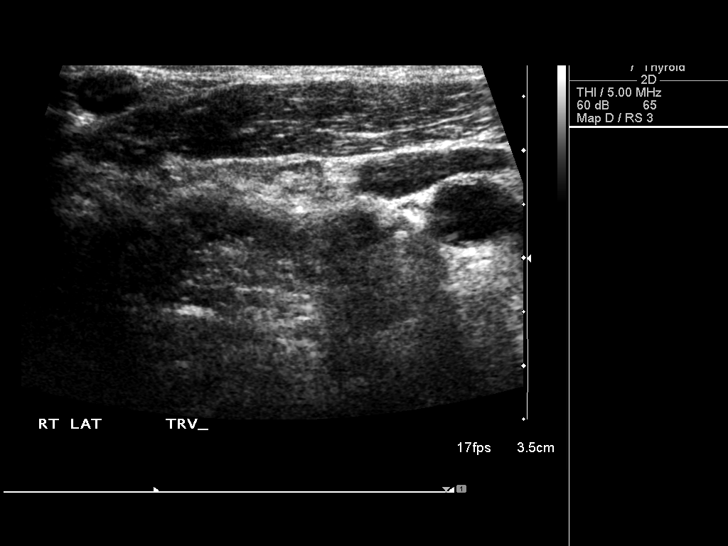

[14 of 25 positions shown; findings below may reference images not displayed]

FINDINGS: Right thyroid lobe

Measurements: 8.1 x 3.8 x 3.6 cm (stable size).

The only discrete nodule remains in the inferior aspect of the right
lobe and measures approximately 3.1 x 2.6 x 2.6 cm (previously 3.0 x
2.5 x 2.5 cm). This nodule appears stable. The rest of the right
lobe demonstrates a diffuse lead nodular appearance. Other nodular
areas measured on the current study and on previous studies by
sonographers are difficult to reproduce and are felt to most likely
represent heterogeneous tissue.

Left thyroid lobe

Measurements: 5.4 x 2.5 x 2.0 cm (stable size).

A stable nodule in the mid left lobe measures approximately 1.8 x
1.6 x 1.6 cm (previously 1.7 x 1.6 x 1.6 cm).

Isthmus

Thickness: 1.1 cm.  Stable heterogeneous enlargement of the isthmus.

Lymphadenopathy

None visualized.
IMPRESSION: Stable thyroid goiter. Dominant right and left nodules are stable in
size. Overall size of the thyroid gland is also stable.

## 2017-01-14 ENCOUNTER — Other Ambulatory Visit: Payer: Self-pay | Admitting: Endocrinology

## 2017-01-14 DIAGNOSIS — E049 Nontoxic goiter, unspecified: Secondary | ICD-10-CM

## 2017-01-28 ENCOUNTER — Ambulatory Visit
Admission: RE | Admit: 2017-01-28 | Discharge: 2017-01-28 | Disposition: A | Discharge status: Home / Self Care | Payer: 59 | Source: Ambulatory Visit | Attending: Endocrinology | Admitting: Endocrinology

## 2017-01-28 DIAGNOSIS — E049 Nontoxic goiter, unspecified: Secondary | ICD-10-CM

## 2017-09-16 ENCOUNTER — Encounter: Payer: 59 | Admitting: Gynecology

## 2017-11-18 ENCOUNTER — Ambulatory Visit (INDEPENDENT_AMBULATORY_CARE_PROVIDER_SITE_OTHER): Payer: 59 | Admitting: Gynecology

## 2017-11-18 ENCOUNTER — Encounter: Payer: Self-pay | Admitting: Gynecology

## 2017-11-18 VITALS — BP 122/78 | Ht 63.0 in | Wt 164.0 lb

## 2017-11-18 DIAGNOSIS — Z01419 Encounter for gynecological examination (general) (routine) without abnormal findings: Secondary | ICD-10-CM | POA: Diagnosis not present

## 2017-11-18 NOTE — Progress Notes (Signed)
    Angela Rios Oct 26, 1965 [id number]        52 y.o.  [id number] for annual gynecologic exam.  Doing well without gynecologic complaints.  Past medical history,surgical history, problem list, medications, allergies, family history and social history were all reviewed and documented as reviewed in the EPIC chart.  ROS:  Performed with pertinent positives and negatives included in the history, assessment and plan.   Additional significant findings : None   Exam: Angela Rios assistant Vitals:   11/18/17 1153  BP: 122/78  Weight: 164 lb (74.4 kg)  Height:  (1.6 m)   Body mass index is 29.05 kg/m.  General appearance:  Normal affect, orientation and appearance. Skin: Grossly normal HEENT: Without gross lesions.  No cervical or supraclavicular adenopathy. Thyroid normal.  Lungs:  Clear without wheezing, rales or rhonchi Cardiac: RR, without RMG Abdominal:  Soft, nontender, without masses, guarding, rebound, organomegaly or hernia Breasts:  Examined lying and sitting without masses, retractions, discharge or axillary adenopathy. Pelvic:  Ext, BUS, Vagina: Normal  Cervix: Normal  Uterus: Anteverted, normal size, shape and contour, midline and mobile nontender   Adnexa: Without masses or tenderness    Anus and perineum: Normal   Rectovaginal: Normal sphincter tone without palpated masses or tenderness.    Assessment/Plan:  52 y.o. [id number] female for annual gynecologic exam.   1. Perimenopausal.  Went 7 to 8 months without menses and then had a spontaneous menses in April.  Otherwise no bleeding.  No significant hot flushes or sweats.  We reviewed the perimenopause and what to expect.  She will call if she goes more than 1 year without bleeding and then bleeds or has prolonged or atypical bleeding.  She will follow-up if she has significant menopausal symptoms that we want to discuss treatment options. 2. Mammography 07/2017.  Continue with annual mammography next year.  Exam normal  today. 3. Pap smear/HPV 12/2013.  No Pap smear done today.  No history of abnormal Pap smears.  Plan repeat Pap smear next year at 5-year interval per current screening guidelines. 4. Colonoscopy never.  Names and numbers provided and she agrees to call and schedule. 5. Health maintenance.  No routine lab work done is this is done at her endocrinologist office.  Follow-up 1 year, sooner as needed.   Angela Lords MD, 12:41 PM 11/18/2017

## 2017-11-18 NOTE — Patient Instructions (Signed)
Schedule your colonoscopy with either:  [facility]   Address: [address], [place], [place] [address]  [phone]    or  [facility]  Address: [address], [place], [place] [address]  [phone]      

## 2018-02-18 ENCOUNTER — Encounter: Payer: Self-pay | Admitting: Gynecology

## 2018-06-27 ENCOUNTER — Other Ambulatory Visit: Payer: Self-pay | Admitting: Endocrinology

## 2018-06-27 DIAGNOSIS — E049 Nontoxic goiter, unspecified: Secondary | ICD-10-CM

## 2018-07-07 ENCOUNTER — Ambulatory Visit
Admission: RE | Admit: 2018-07-07 | Discharge: 2018-07-07 | Disposition: A | Discharge status: Home / Self Care | Payer: 59 | Source: Ambulatory Visit | Attending: Endocrinology | Admitting: Endocrinology

## 2018-07-07 DIAGNOSIS — E049 Nontoxic goiter, unspecified: Secondary | ICD-10-CM

## 2018-08-04 ENCOUNTER — Encounter: Payer: Self-pay | Admitting: Gynecology

## 2019-03-15 ENCOUNTER — Encounter: Payer: Self-pay | Admitting: Gynecology

## 2019-05-20 IMAGING — US US THYROID
1 series · 13 of 25 positions shown · non-contrast
Comparison: 07/31/2015 and previous back to 01/28/2012

CLINICAL DATA: Goiter.

EXAM:
THYROID ULTRASOUND
TECHNIQUE: Ultrasound examination of the thyroid gland and adjacent soft
tissues was performed.

[Series 1: us thyroid · 0.06mm/px · 13 of 46 slices shown]
[im 1/46]
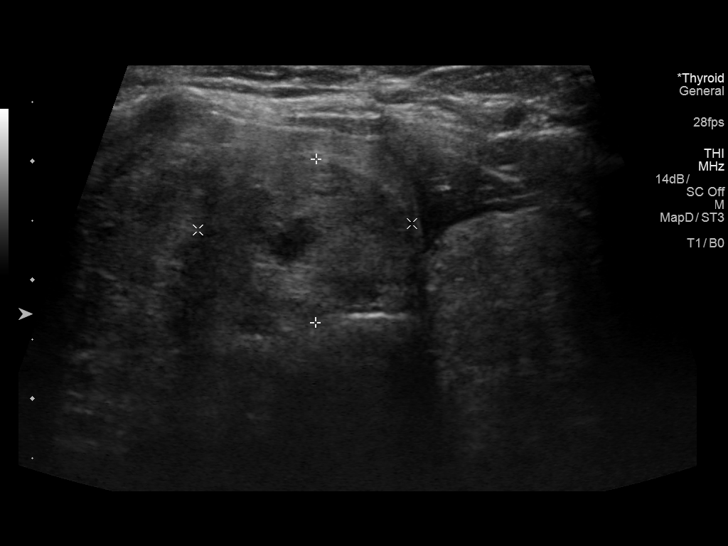
[im 4/46]
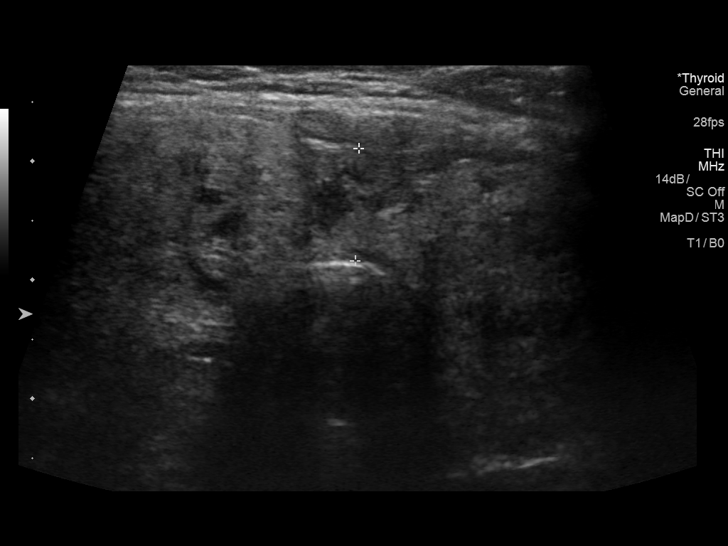
[im 8/46]
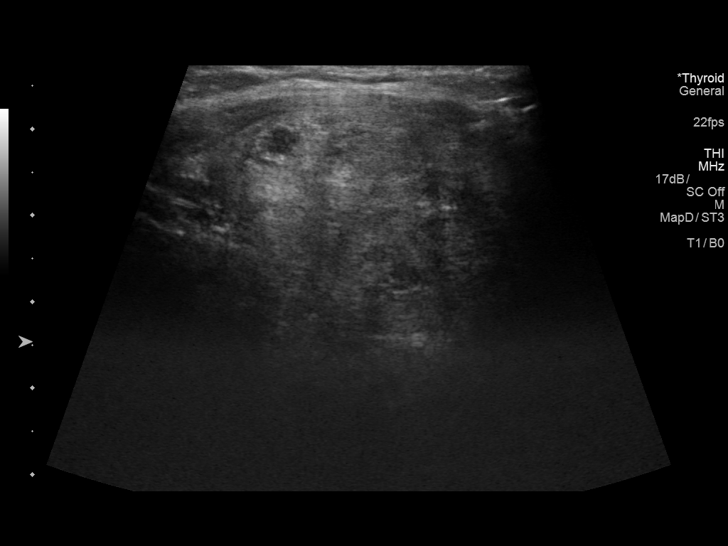
[im 12/46]
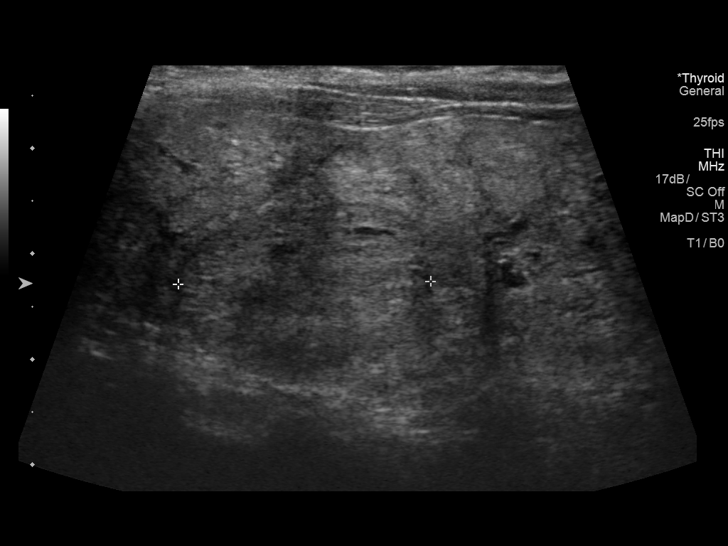
[im 16/46]
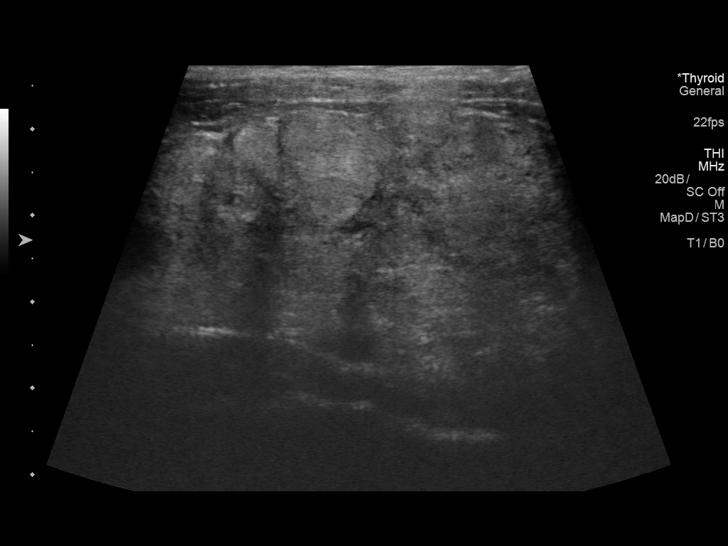
[im 19/46]
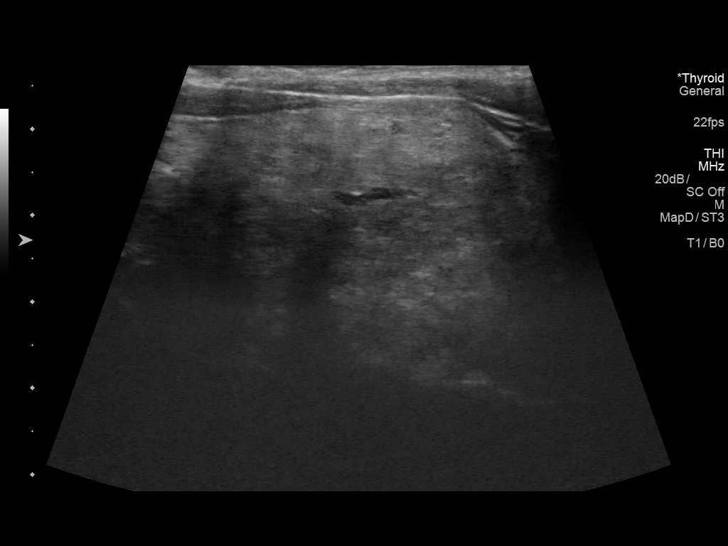
[im 23/46]
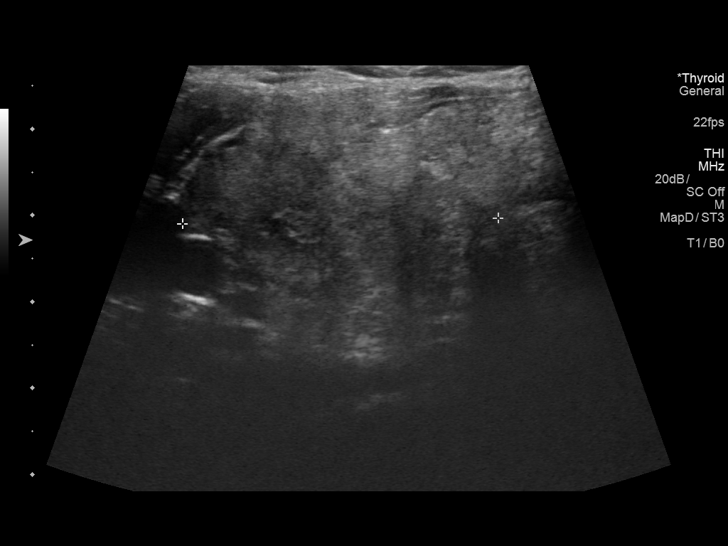
[im 27/46]
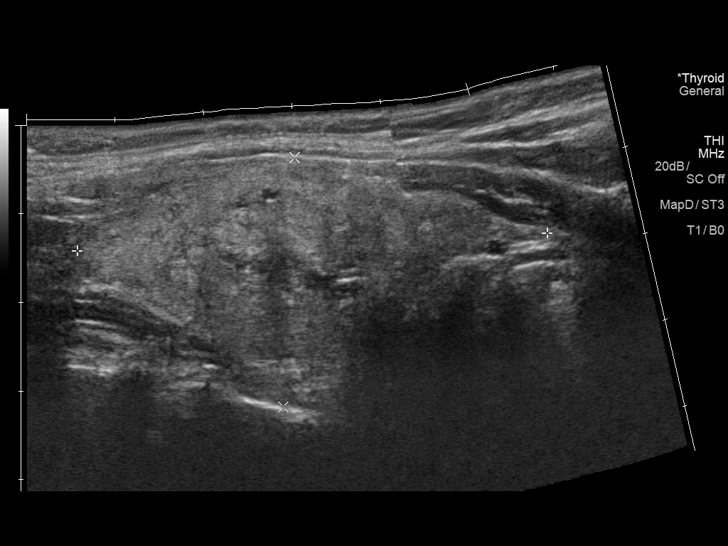
[im 31/46]
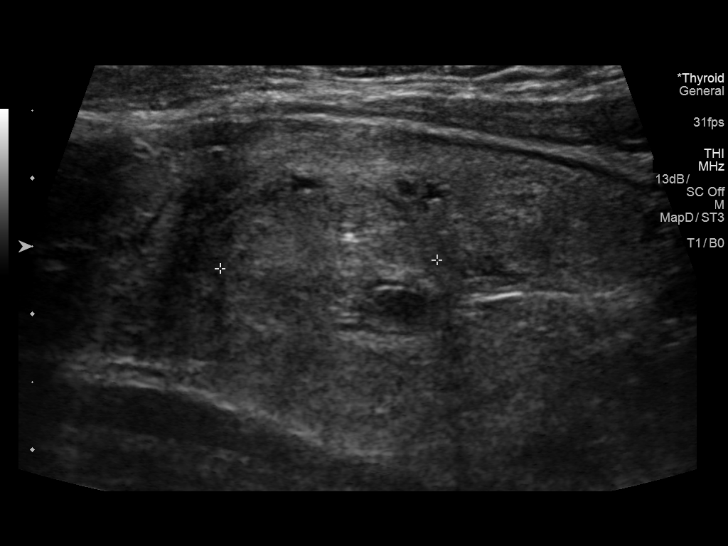
[im 34/46]
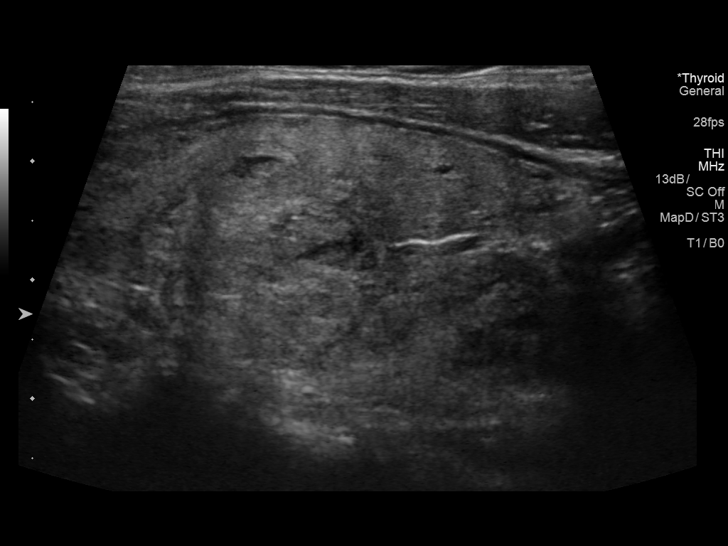
[im 38/46]
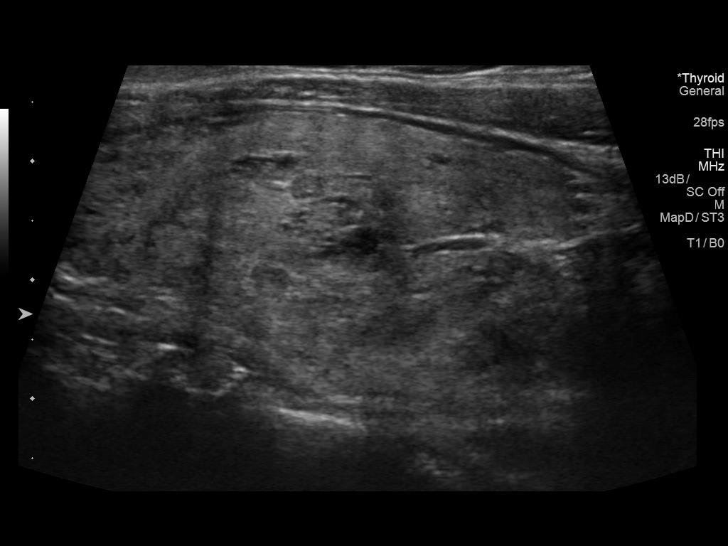
[im 42/46]
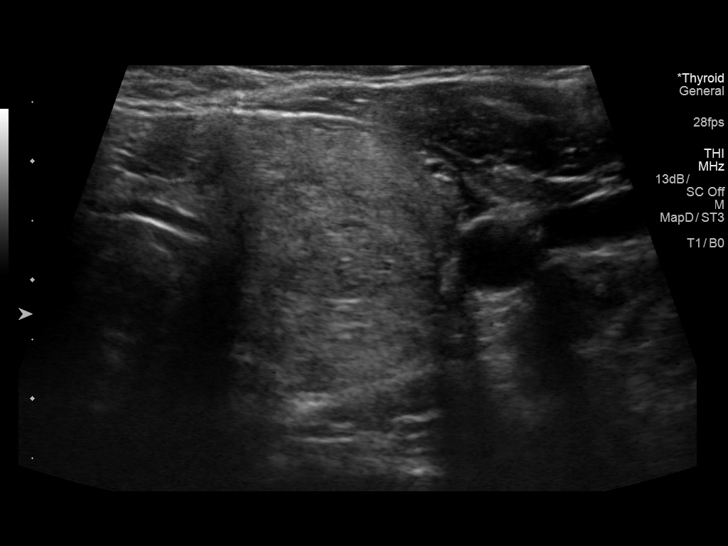
[im 46/46]
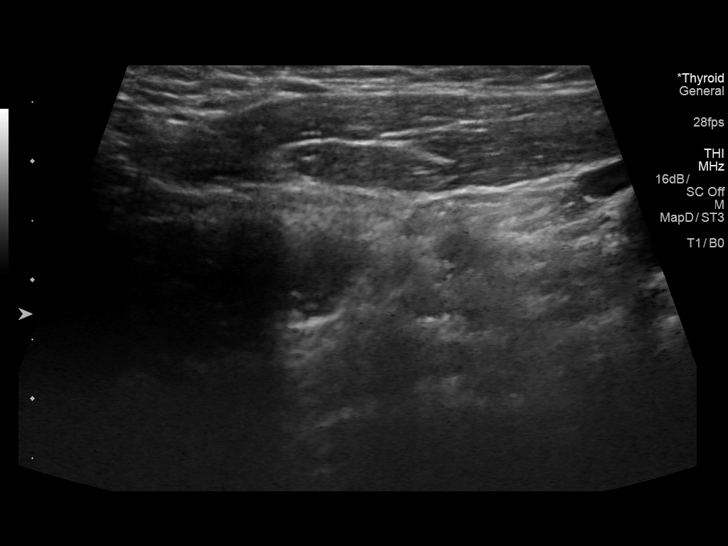

[13 of 25 positions shown; findings below may reference images not displayed]

FINDINGS: Parenchymal Echotexture: Moderately heterogenous

Isthmus: 1 cm in thickness, previously

Right lobe: 7.9 x 3 x 3.7 cm, previously 8.1 x 3.8 x

Left lobe: 5.3 x 2.8 x 1.8 cm, previously 5.4 x 2.5 x 2

_________________________________________________________

Estimated total number of nodules >/= 1 cm: 4

Number of spongiform nodules >/=  2 cm not described below (TR1): 0

Number of mixed cystic and solid nodules >/= 1.5 cm not described
below (TR2): 0

_________________________________________________________

Nodule # 1:

Prior biopsy: No

Location: Right; Superior

Maximum size: 1.5 cm; Other 2 dimensions: 1.5 x 1.4 cm, previously,
1.5 x 1.4 x 1.5 cm

Composition: solid/almost completely solid (2)

Echogenicity: isoechoic (1)

Shape: not taller-than-wide (0)

Margins: ill-defined (0)

Echogenic foci: none (0)

ACR TI-RADS total points: 3.

ACR TI-RADS risk category:  TR3 (3 points).

Significant change in size (>/= 20% in two dimensions and minimal
increase of 2 mm): No

Change in features: No

Change in ACR TI-RADS risk category: No

ACR TI-RADS recommendations:

*Given size (>/= 1.5 - 2.4 cm) and appearance, a follow-up
ultrasound in 1 year should be considered based on TI-RADS criteria.

_________________________________________________________

2.4 x 1.7 x 2.2 cm inferior right nodule, previously 3.3 x 2.9 x
on 01/28/2012

2.3 x 1.4 x 1.8 cm right isthmic nodule, previously 3.8 x 1.9 x
on 01/28/2012

1.6 x 2 x 1.7 cm mid left nodule, previously 1.6 x 1.4 x 1.5 cm on
01/28/2012
IMPRESSION: 1. Persistent thyromegaly with bilateral nodules.
2. Recommend 1-2 year follow-up surveillance ultrasound of right
upper nodule until stability over 5 years is demonstrated.
3. Additional nodules have been stable for greater than 5 years,
implying benignity, and no further imaging follow-up is recommended.

The above is in keeping with the ACR TI-RADS recommendations - [HOSPITAL] 6149;[DATE].

## 2019-08-09 ENCOUNTER — Other Ambulatory Visit: Payer: Self-pay

## 2019-08-10 ENCOUNTER — Encounter: Payer: 59 | Admitting: Obstetrics and Gynecology

## 2019-10-09 ENCOUNTER — Other Ambulatory Visit: Payer: Self-pay

## 2019-10-09 ENCOUNTER — Encounter: Payer: Self-pay | Admitting: Obstetrics and Gynecology

## 2019-10-09 ENCOUNTER — Ambulatory Visit (INDEPENDENT_AMBULATORY_CARE_PROVIDER_SITE_OTHER): Payer: 59 | Admitting: Obstetrics and Gynecology

## 2019-10-09 VITALS — BP 118/78 | Ht 63.0 in | Wt 156.0 lb

## 2019-10-09 DIAGNOSIS — Z01419 Encounter for gynecological examination (general) (routine) without abnormal findings: Secondary | ICD-10-CM | POA: Diagnosis not present

## 2019-10-09 NOTE — Progress Notes (Signed)
   Angela Rios 1965/10/06 [id number]  SUBJECTIVE:  54 y.o. [id number] female for annual routine gynecologic exam and Pap smear. She has no gynecologic concerns.  Current Outpatient Medications  Medication Sig Dispense Refill  . aspirin EC 81 MG tablet Take 81 mg by mouth daily.    . cholecalciferol (VITAMIN D) 1000 units tablet Take 1,000 Units by mouth daily.    . insulin aspart (NOVOLOG) 100 UNIT/ML injection Inject into the skin as needed.    . Multiple Vitamin (MULTIVITAMIN) tablet Take 1 tablet by mouth daily.     No current facility-administered medications for this visit.   Allergies: Patient has no known allergies.  Patient's last menstrual period was 10/14/2017.  Past medical history,surgical history, problem list, medications, allergies, family history and social history were all reviewed and documented as reviewed in the EPIC chart.  ROS:  Feeling well. No dyspnea or chest pain on exertion.  No abdominal pain, change in bowel habits, black or bloody stools.  No urinary tract symptoms. GYN ROS: normal menses, no abnormal bleeding, pelvic pain or discharge, no breast pain or new or enlarging lumps on self exam. No neurological complaints.    OBJECTIVE:  BP 118/78   Ht 5\' 3"  (1.6 m)   Wt 156 lb (70.8 kg)   LMP 10/14/2017   BMI 27.63 kg/m  The patient appears well, alert, oriented x 3, in no distress. ENT normal.  Neck supple. No cervical or supraclavicular adenopathy or thyromegaly.  Lungs are clear, good air entry, no wheezes, rhonchi or rales. S1 and S2 normal, no murmurs, regular rate and rhythm.  Abdomen soft without tenderness, guarding, mass or organomegaly.  Neurological is normal, no focal findings.  BREAST EXAM: breasts appear normal, no suspicious masses, no skin or nipple changes or axillary nodes  PELVIC EXAM: VULVA: normal appearing vulva with no masses, tenderness or lesions, VAGINA: normal appearing vagina with normal color and discharge, no lesions,  CERVIX: normal appearing cervix without discharge or lesions, external os is stenotic, UTERUS: uterus is normal size, shape, consistency and nontender, ADNEXA: normal adnexa in size, nontender and no masses, PAP: Pap smear done today, thin-prep method  Chaperone: 10/16/2017 present during the examination  ASSESSMENT:  53 y.o. 40 here for annual gynecologic exam  PLAN:   1. Postmenopausal.  No significant hot flashes or night sweats.  No vaginal bleeding. 2. Pap smear-HPV 12/2013.  No significant history of abnormal Pap smears.  Pap smear is collected today. 3. Mammogram 08/2019.  Normal breast exam today.  Continue annual mammography. 4. Colonoscopy 2019.  Recommended that she follow up at the recommended interval.   5. DEXA will be recommended when a bit further in to menopause.  Will discuss this further in the coming years. 6. Health maintenance.  No labs today as she normally has these completed elsewhere.  Return annually or sooner, prn.  2020 MD 10/09/19

## 2019-10-09 NOTE — Addendum Note (Signed)
Addended by: Dayna Barker on: 10/09/2019 01:53 PM   Modules accepted: Orders

## 2019-10-11 LAB — PAP IG W/ RFLX HPV ASCU

## 2019-11-06 ENCOUNTER — Other Ambulatory Visit: Payer: Self-pay

## 2019-11-06 ENCOUNTER — Encounter: Payer: Self-pay | Admitting: Obstetrics and Gynecology

## 2019-11-06 ENCOUNTER — Ambulatory Visit: Payer: 59 | Admitting: Obstetrics and Gynecology

## 2019-11-06 VITALS — BP 118/76

## 2019-11-06 DIAGNOSIS — N941 Unspecified dyspareunia: Secondary | ICD-10-CM

## 2019-11-06 DIAGNOSIS — Z124 Encounter for screening for malignant neoplasm of cervix: Secondary | ICD-10-CM

## 2019-11-06 NOTE — Progress Notes (Signed)
   Amai Cappiello Mccomber Sep 27, 1965 [id number]  SUBJECTIVE:  54 y.o. [id number] female presents for a repeat Pap smear.  The Pap smear collected at her routine exam on 10/09/2019 had insufficient cells for analysis.  Was noted to have cervical stenosis at that examination.  Since she is here today she also does want to discuss dyspareunia.  She has been having this concern in the last several months.  She has not tried any lubricants in a while, informed suggestions on improving the symptoms.  Current Outpatient Medications  Medication Sig Dispense Refill  . aspirin EC 81 MG tablet Take 81 mg by mouth daily.    . cholecalciferol (VITAMIN D) 1000 units tablet Take 1,000 Units by mouth daily.    . insulin aspart (NOVOLOG) 100 UNIT/ML injection Inject into the skin as needed.    . Multiple Vitamin (MULTIVITAMIN) tablet Take 1 tablet by mouth daily.     No current facility-administered medications for this visit.   Allergies: Patient has no known allergies.  Patient's last menstrual period was 10/14/2017.  Past medical history,surgical history, problem list, medications, allergies, family history and social history were all reviewed and documented as reviewed in the EPIC chart.  ROS:  Feeling well. No dyspnea or chest pain on exertion.  No abdominal pain, change in bowel habits, black or bloody stools.  No urinary tract symptoms. GYN ROS: normal menses, no abnormal bleeding, pelvic pain or discharge.   OBJECTIVE:  BP 118/76   LMP 10/14/2017  The patient appears well, alert, oriented x 3, in no distress. PELVIC EXAM: VULVA: normal appearing vulva with no masses, tenderness or lesions, VAGINA: normal appearing vagina with normal color and discharge, no lesions, CERVIX: normal appearing cervix without discharge or lesions, external os is stenotic.  A cervical os finder was used to gently dilate the distal endocervical canal past the stenosis.  Pap smear was then collected including a Cytobrush sampling of the  endocervical canal.  No pain or tenderness noted during the vacuum examination.  Chaperone: [name] present during the examination  ASSESSMENT:  54 y.o. [id number] here for a Pap smear and discussion of dyspareunia  PLAN:  1.  Pap smear is collected.  May have been insufficient sampling last time due to the stenosis of the external os which was opened today with gentle dilation. 2.  Dyspareunia.  Discussed vaginal atrophy and dryness as part of the symptoms with menopause.  I recommended using a good water-based or silicone based lubricant to start.  If she is not noticing any improvement, she may want to consider trial of vaginal estrogen cream therapy.  We discussed some of the risks with systemic absorption and the risks of estrogen use in general to include thrombotic diseases (heart attack, stroke, DVT, PE), endometrial cancer, breast cancer.  Generally low risk if using the low dose of vaginal estrogen cream as it is prescribed.  She will let us know if she is in need of a prescription.   Theresia Majors MD 11/06/19

## 2019-11-07 LAB — PAP IG W/ RFLX HPV ASCU

## 2020-09-23 ENCOUNTER — Encounter: Payer: Self-pay | Admitting: Obstetrics and Gynecology

## 2020-10-10 ENCOUNTER — Encounter: Payer: 59 | Admitting: Obstetrics and Gynecology

## 2020-10-26 IMAGING — US US THYROID
1 series · 13 of 25 positions shown · non-contrast
Comparison: 01/28/2017 and previous back to 01/28/2012

CLINICAL DATA: Goiter

EXAM:
THYROID ULTRASOUND
TECHNIQUE: Ultrasound examination of the thyroid gland and adjacent soft
tissues was performed.

[Series 1: us thyroid · 0.07mm/px · 13 of 64 slices shown]
[im 1/64]
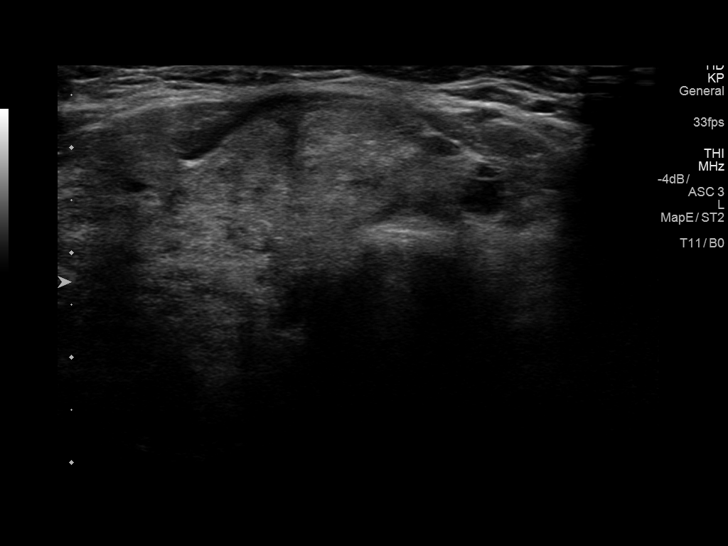
[im 6/64]
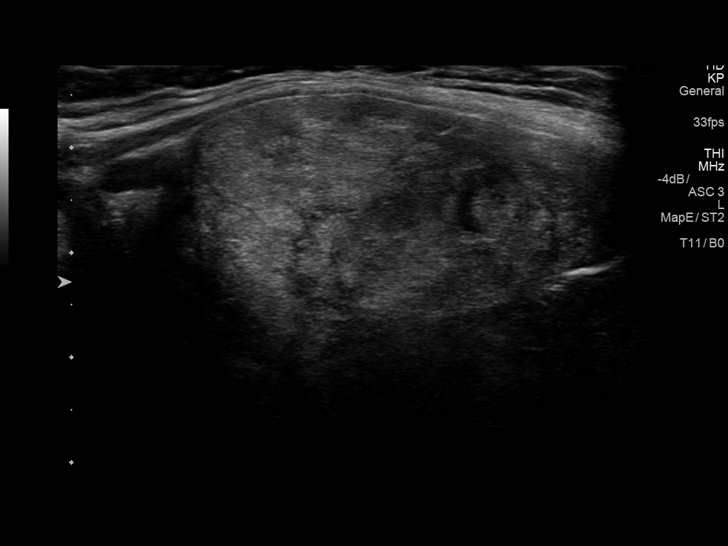
[im 11/64]
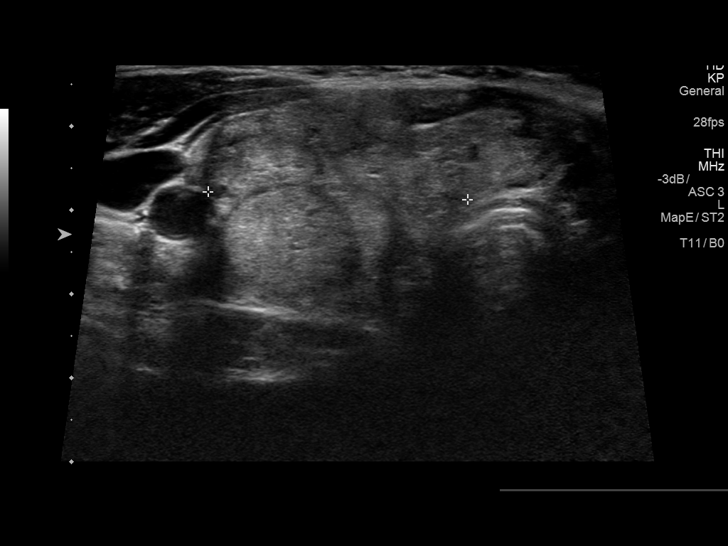
[im 16/64]
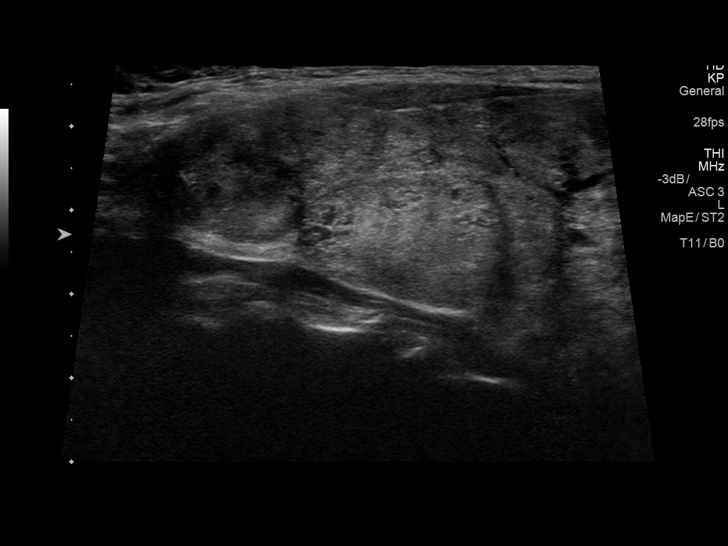
[im 22/64]
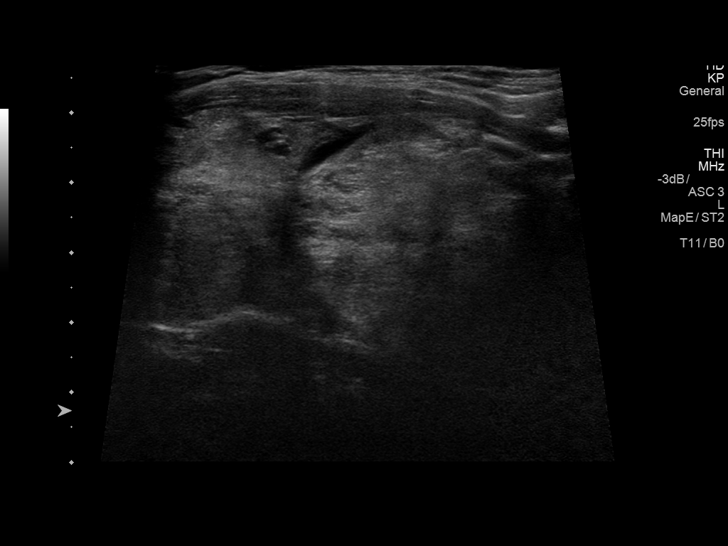
[im 27/64]
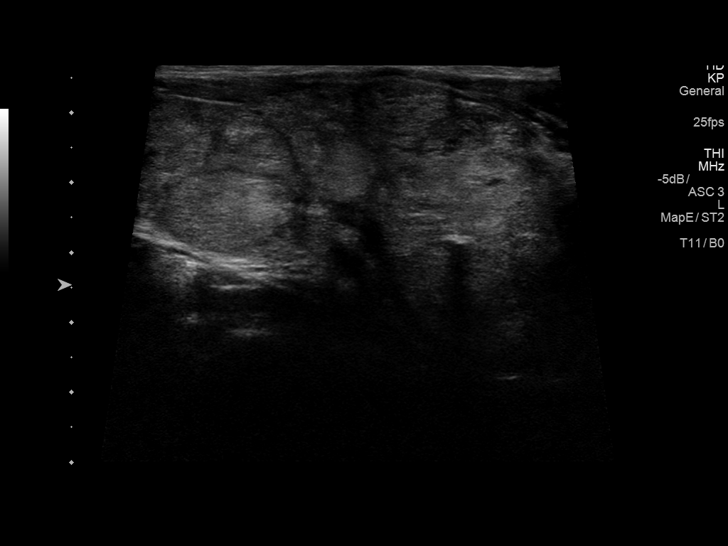
[im 32/64]
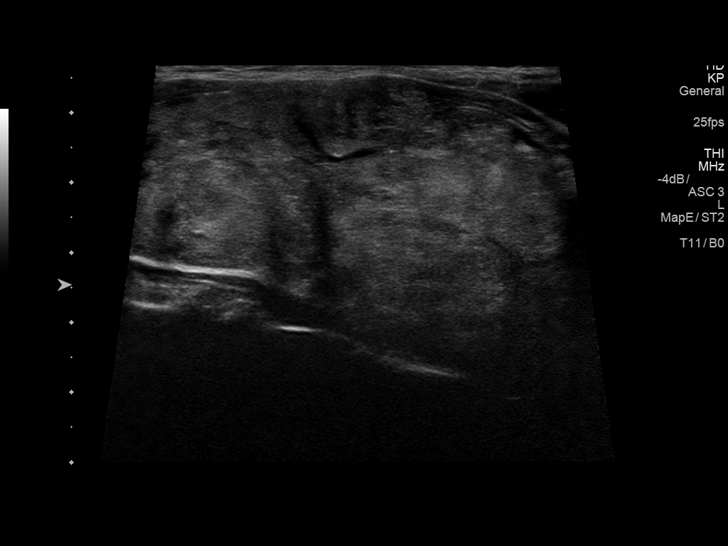
[im 37/64]
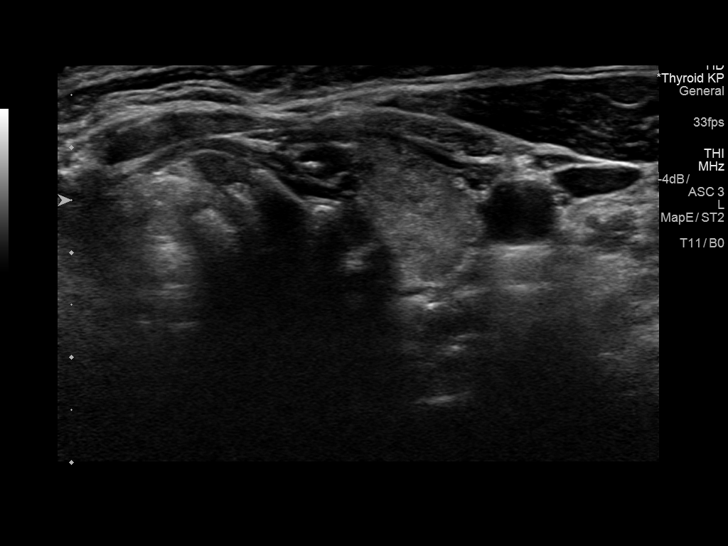
[im 43/64]
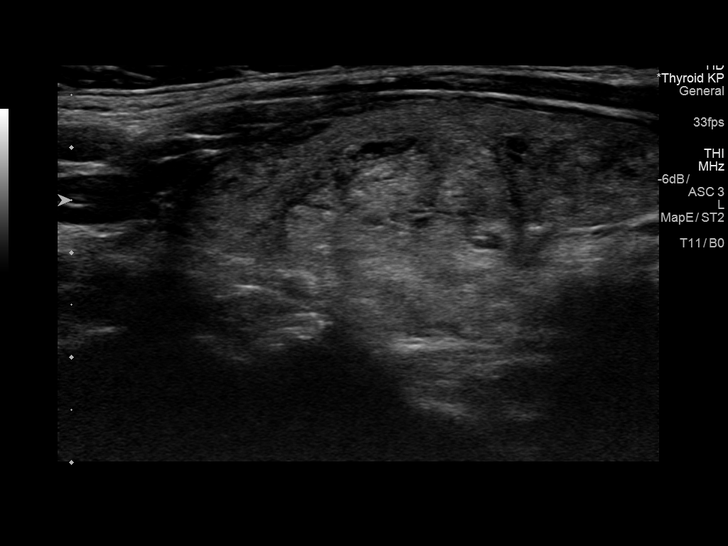
[im 48/64]
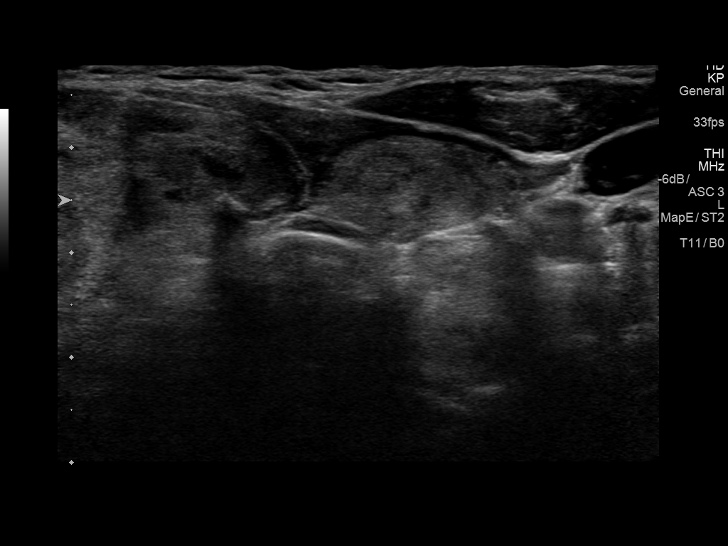
[im 53/64]
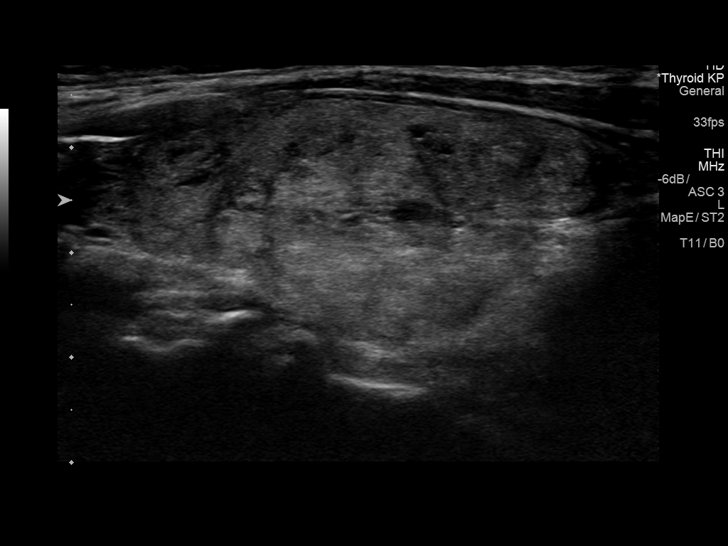
[im 58/64]
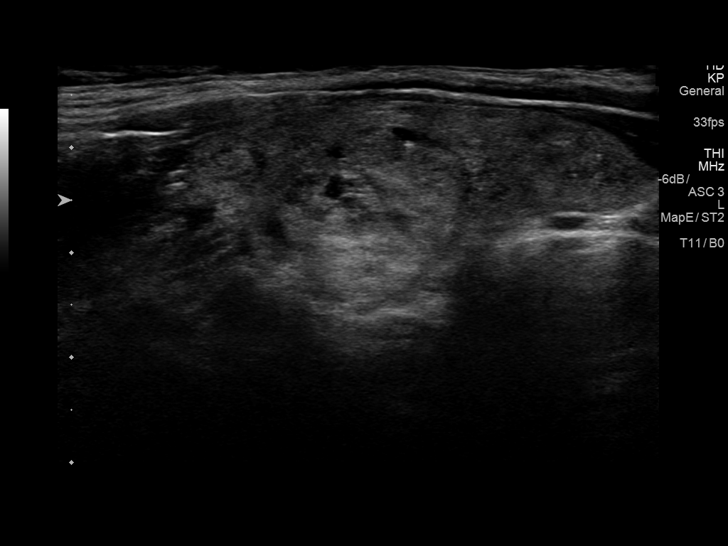
[im 64/64]
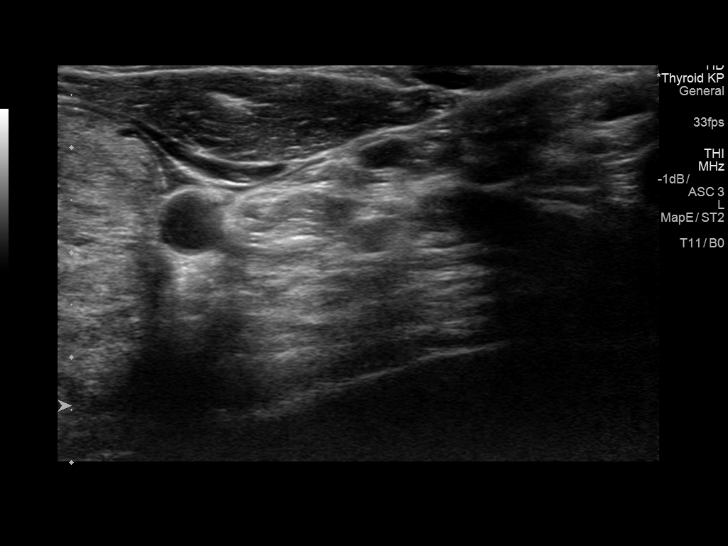

[13 of 25 positions shown; findings below may reference images not displayed]

FINDINGS: Parenchymal Echotexture: Markedly heterogenous

Isthmus: 1.6 cm thickness, previously 1 cm

Right lobe: 7.9 x 3.8 x 3.1 cm, previously 7.9 x 3 x

Left lobe: 5.3 x 2.4 x 2.2 cm, previously 5.3 x 2.8 x

_________________________________________________________

Estimated total number of nodules >/= 1 cm: 1

Number of spongiform nodules >/=  2 cm not described below (TR1): 0

Number of mixed cystic and solid nodules >/= 1.5 cm not described
below (TR2): 0

_________________________________________________________

Nodule # 1:

Prior biopsy: No

Location: Left; Mid

Maximum size: 1.9 cm; Other 2 dimensions: 1.8 x 1.8 cm, previously,
2 x 1.7 x 1.6 cm on most recent study, 1.6 x 1.4 x 1.5 on 01/28/2012

Composition: solid/almost completely solid (2)

Echogenicity: isoechoic (1)

Shape: taller-than-wide (3)

Margins: ill-defined (0)

Echogenic foci: none (0)

ACR TI-RADS total points: 6.

ACR TI-RADS risk category:  TR4 (4-6 points).

Significant change in size (>/= 20% in two dimensions and minimal
increase of 2 mm): No

Change in features: No

Change in ACR TI-RADS risk category: No

ACR TI-RADS recommendations:

Stability over greater than 5 years implies benignity. No biopsy or
dedicated imaging follow-up recommended.

_________________________________________________________

Additional nodules described on the most recent prior study are not
discretely identified in the background of markedly heterogenous and
nodular thyroid parenchyma.
IMPRESSION: 1. Stable thyromegaly with markedly heterogenous parenchyma.

The above is in keeping with the ACR TI-RADS recommendations - [HOSPITAL] 6140;[DATE].

## 2020-11-20 ENCOUNTER — Ambulatory Visit: Payer: 59 | Admitting: Nurse Practitioner

## 2020-12-12 ENCOUNTER — Ambulatory Visit: Payer: 59 | Admitting: Nurse Practitioner

## 2021-01-15 NOTE — Progress Notes (Signed)
   Angela Rios 04-26-1966 [id number]   History:  55 y.o. [id number] presents for annual exam without GYN complaints. Postmenopausal - no HRT, no bleeding. Normal pap and mammogram history. T1DM, thyroid nodules managed by endocrinology, has insulin pump. She gets thyroid ultrasound every 2 years. Interested in Shingles vaccine.   Gynecologic History Patient's last menstrual period was 10/14/2017.   Contraception/Family planning: post menopausal status  Health Maintenance Last Pap: 11/06/2019. Results were: Normal, 3-year repeat Last mammogram: 09/23/2020. Results were: Normal Last colonoscopy: 2019 Last Dexa: Not indicated  Past medical history, past surgical history, family history and social history were all reviewed and documented in the EPIC chart. Married. PT. 2 daughters (one in [place] for grad school/SW, oldest daughter in [place], son at [employer] [facility] [employer].  ROS:  A ROS was performed and pertinent positives and negatives are included.  Exam:  Vitals:   01/16/21 0802  BP: 120/80  Weight: 152 lb (68.9 kg)  Height: 5' 2.5" (1.588 m)   Body mass index is 27.36 kg/m.  General appearance:  Normal Thyroid:  Symmetrical, normal in size, without palpable masses or nodularity. Respiratory  Auscultation:  Clear without wheezing or rhonchi Cardiovascular  Auscultation:  Regular rate, without rubs, murmurs or gallops  Edema/varicosities:  Not grossly evident Abdominal  Soft,nontender, without masses, guarding or rebound.  Liver/spleen:  No organomegaly noted  Hernia:  None appreciated  Skin  Inspection:  Grossly normal Breasts: Examined lying and sitting.   Right: Without masses, retractions, nipple discharge or axillary adenopathy.   Left: Without masses, retractions, nipple discharge or axillary adenopathy. Genitourinary   Inguinal/mons:  Normal without inguinal adenopathy  External genitalia:  Normal appearing vulva with no masses, tenderness, or lesions  BUS/Urethra/Skene's  glands:  Normal  Vagina:  Normal appearing with normal color and discharge, no lesions  Cervix:  Normal appearing without discharge or lesions  Uterus:  Normal in size, shape and contour.  Midline and mobile, nontender  Adnexa/parametria:     Rt: Normal in size, without masses or tenderness.   Lt: Normal in size, without masses or tenderness.  Anus and perineum: Normal  Digital rectal exam: Normal sphincter tone without palpated masses or tenderness  Patient informed chaperone available to be present for breast and pelvic exam. Patient has requested no chaperone to be present. Patient has been advised what will be completed during breast and pelvic exam.   Assessment/Plan:  55 y.o. [id number] for annual exam.   Well female exam with routine gynecological exam - Education provided on SBEs, importance of preventative screenings, current guidelines, high calcium diet, regular exercise, and multivitamin daily. Labs done at Dr. Willeen Cass office. Encouraged to get shingles vaccine. She is going to check with Dr. Willeen Cass office to see if they have it, if not we will send prescription to her pharmacy.   Postmenopausal - no HRT, on bleeding.  Screening for cervical cancer - Normal Pap history.  Will repeat at 3-year interval per guidelines.  Screening for breast cancer - Normal mammogram history.  Continue annual screenings.  Normal breast exam today.  Screening for colon cancer - 2019 colonoscopy. Will repeat at GI's recommended interval.   Screening for osteoporosis - She is a type 1 diabetic so it is recommended she have Dexa earlier. Will discuss in more detail next year.   Return in 1 year for annual.   Olivia Mackie DNP, 8:24 AM 01/16/2021

## 2021-01-16 ENCOUNTER — Ambulatory Visit (INDEPENDENT_AMBULATORY_CARE_PROVIDER_SITE_OTHER): Payer: 59 | Admitting: Nurse Practitioner

## 2021-01-16 ENCOUNTER — Encounter: Payer: Self-pay | Admitting: Nurse Practitioner

## 2021-01-16 ENCOUNTER — Other Ambulatory Visit: Payer: Self-pay

## 2021-01-16 VITALS — BP 120/80 | Ht 62.5 in | Wt 152.0 lb

## 2021-01-16 DIAGNOSIS — Z780 Asymptomatic menopausal state: Secondary | ICD-10-CM | POA: Diagnosis not present

## 2021-01-16 DIAGNOSIS — Z01419 Encounter for gynecological examination (general) (routine) without abnormal findings: Secondary | ICD-10-CM

## 2021-10-01 ENCOUNTER — Encounter: Payer: Self-pay | Admitting: Nurse Practitioner

## 2021-10-10 ENCOUNTER — Encounter: Payer: Self-pay | Admitting: Nurse Practitioner

## 2021-10-13 ENCOUNTER — Other Ambulatory Visit: Payer: Self-pay | Admitting: Nurse Practitioner

## 2021-10-13 DIAGNOSIS — N951 Menopausal and female climacteric states: Secondary | ICD-10-CM

## 2021-10-13 DIAGNOSIS — N941 Unspecified dyspareunia: Secondary | ICD-10-CM

## 2021-10-13 MED ORDER — ESTRADIOL 10 MCG VA TABS: 1.0000 | ORAL_TABLET | VAGINAL | 2 refills | Status: DC

## 2021-11-28 ENCOUNTER — Other Ambulatory Visit: Payer: Self-pay

## 2021-11-28 DIAGNOSIS — N951 Menopausal and female climacteric states: Secondary | ICD-10-CM

## 2021-11-28 DIAGNOSIS — N941 Unspecified dyspareunia: Secondary | ICD-10-CM

## 2021-11-28 NOTE — Telephone Encounter (Signed)
Rx request from home delivery for 90-day supply. Rx originally sent on 10/13/21 for #[phone] w/ 2RF to [facility]  Last AEX-01/16/21-scheduled 01/20/22. Lmammo-09/29/21

## 2021-11-30 MED ORDER — ESTRADIOL 10 MCG VA TABS: 1.0000 | ORAL_TABLET | VAGINAL | 0 refills | Status: DC

## 2021-12-09 ENCOUNTER — Other Ambulatory Visit: Payer: Self-pay | Admitting: Endocrinology

## 2021-12-09 DIAGNOSIS — E049 Nontoxic goiter, unspecified: Secondary | ICD-10-CM

## 2022-01-19 NOTE — Progress Notes (Deleted)
   Angela Rios 18-Jan-1966 [id number]   History:  56 y.o. [id number] presents for annual exam without GYN complaints. Postmenopausal - no HRT, no bleeding. Normal pap and mammogram history. T1DM, thyroid nodules managed by endocrinology, has insulin pump. She gets thyroid ultrasound every 2 years.   Gynecologic History Patient's last menstrual period was 10/14/2017.   Contraception/Family planning: post menopausal status Sexually active: ***  Health Maintenance Last Pap: 11/06/2019. Results were: Normal, 3-year repeat Last mammogram: 10/01/2021. Results were: Normal Last colonoscopy: 2019 Last Dexa: Never  Past medical history, past surgical history, family history and social history were all reviewed and documented in the EPIC chart. Married. PT. 2 daughters (one in [place] for grad school/SW, oldest daughter in [place], son at [employer].  ROS:  A ROS was performed and pertinent positives and negatives are included.  Exam:  There were no vitals filed for this visit.  There is no height or weight on file to calculate BMI.  General appearance:  Normal Thyroid:  Symmetrical, normal in size, without palpable masses or nodularity. Respiratory  Auscultation:  Clear without wheezing or rhonchi Cardiovascular  Auscultation:  Regular rate, without rubs, murmurs or gallops  Edema/varicosities:  Not grossly evident Abdominal  Soft,nontender, without masses, guarding or rebound.  Liver/spleen:  No organomegaly noted  Hernia:  None appreciated  Skin  Inspection:  Grossly normal Breasts: Examined lying and sitting.   Right: Without masses, retractions, nipple discharge or axillary adenopathy.   Left: Without masses, retractions, nipple discharge or axillary adenopathy. Genitourinary   Inguinal/mons:  Normal without inguinal adenopathy  External genitalia:  Normal appearing vulva with no masses, tenderness, or lesions  BUS/Urethra/Skene's glands:  Normal  Vagina:  Normal appearing  with normal color and discharge, no lesions  Cervix:  Normal appearing without discharge or lesions  Uterus:  Normal in size, shape and contour.  Midline and mobile, nontender  Adnexa/parametria:     Rt: Normal in size, without masses or tenderness.   Lt: Normal in size, without masses or tenderness.  Anus and perineum: Normal  Digital rectal exam: Normal sphincter tone without palpated masses or tenderness  Patient informed chaperone available to be present for breast and pelvic exam. Patient has requested no chaperone to be present. Patient has been advised what will be completed during breast and pelvic exam.   Assessment/Plan:  56 y.o. [id number] for annual exam.   Well female exam with routine gynecological exam - Education provided on SBEs, importance of preventative screenings, current guidelines, high calcium diet, regular exercise, and multivitamin daily. Labs done at Dr. Willeen Cass office. Encouraged to get shingles vaccine. She is going to check with Dr. Willeen Cass office to see if they have it, if not we will send prescription to her pharmacy.   Postmenopausal - no HRT, on bleeding.  Screening for cervical cancer - Normal Pap history.  Will repeat at 3-year interval per guidelines.  Screening for breast cancer - Normal mammogram history.  Continue annual screenings.  Normal breast exam today.  Screening for colon cancer - 2019 colonoscopy. Will repeat at GI's recommended interval.   Screening for osteoporosis - She is a type 1 diabetic. Recommend DXA now.   Return in 1 year for annual.     Olivia Mackie DNP, 3:28 PM 01/19/2022

## 2022-01-20 ENCOUNTER — Ambulatory Visit: Payer: 59 | Admitting: Nurse Practitioner

## 2022-01-20 DIAGNOSIS — Z01419 Encounter for gynecological examination (general) (routine) without abnormal findings: Secondary | ICD-10-CM

## 2022-01-20 DIAGNOSIS — Z0289 Encounter for other administrative examinations: Secondary | ICD-10-CM

## 2022-01-20 DIAGNOSIS — Z780 Asymptomatic menopausal state: Secondary | ICD-10-CM

## 2022-02-17 ENCOUNTER — Ambulatory Visit: Payer: 59 | Admitting: Nurse Practitioner

## 2022-03-24 ENCOUNTER — Encounter: Payer: Self-pay | Admitting: Nurse Practitioner

## 2022-03-24 ENCOUNTER — Other Ambulatory Visit (HOSPITAL_COMMUNITY)
Admission: RE | Admit: 2022-03-24 | Discharge: 2022-03-24 | Disposition: A | Discharge status: Home / Self Care | Payer: No Typology Code available for payment source | Source: Ambulatory Visit | Attending: Nurse Practitioner | Admitting: Nurse Practitioner

## 2022-03-24 ENCOUNTER — Ambulatory Visit (INDEPENDENT_AMBULATORY_CARE_PROVIDER_SITE_OTHER): Payer: No Typology Code available for payment source | Admitting: Nurse Practitioner

## 2022-03-24 VITALS — BP 116/82 | Ht 62.25 in | Wt 154.0 lb

## 2022-03-24 DIAGNOSIS — N951 Menopausal and female climacteric states: Secondary | ICD-10-CM

## 2022-03-24 DIAGNOSIS — N941 Unspecified dyspareunia: Secondary | ICD-10-CM | POA: Diagnosis not present

## 2022-03-24 DIAGNOSIS — Z01419 Encounter for gynecological examination (general) (routine) without abnormal findings: Secondary | ICD-10-CM

## 2022-03-24 DIAGNOSIS — Z780 Asymptomatic menopausal state: Secondary | ICD-10-CM | POA: Diagnosis not present

## 2022-03-24 MED ORDER — ESTRADIOL 10 MCG VA TABS: 1.0000 | ORAL_TABLET | VAGINAL | 3 refills | Status: DC

## 2022-03-24 NOTE — Progress Notes (Signed)
   Angela Rios 1966-01-29 [id number]   History:  56 y.o. [id number] presents for annual exam without GYN complaints. Postmenopausal. Using vaginal estrogen twice weekly for painful intercourse and vaginal dryness with good management. Normal pap history. T1DM, thyroid nodules managed by endocrinology, has insulin pump. She gets thyroid ultrasound every 2 years.   Gynecologic History Patient's last menstrual period was 10/14/2017.   Contraception/Family planning: post menopausal status Sexually active: Yes  Health Maintenance Last Pap: 11/06/2019. Results were: Normal, 3-year repeat Last mammogram: 10/01/2021. Results were: Normal Last colonoscopy: 2019 Last Dexa: Not indicated  Past medical history, past surgical history, family history and social history were all reviewed and documented in the EPIC chart. 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   ROS:  A ROS was performed and pertinent positives and negatives are included.  Exam:  Vitals:   03/24/22 1150  BP: 116/82  Weight: 154 lb (69.9 kg)  Height: 5' 2.25" (1.581 m)    Body mass index is 27.94 kg/m.  General appearance:  Normal Thyroid:  Symmetrical, normal in size, without palpable masses or nodularity. Respiratory  Auscultation:  Clear without wheezing or rhonchi Cardiovascular  Auscultation:  Regular rate, without rubs, murmurs or gallops  Edema/varicosities:  Not grossly evident Abdominal  Soft,nontender, without masses, guarding or rebound.  Liver/spleen:  No organomegaly noted  Hernia:  None appreciated  Skin  Inspection:  Grossly normal Breasts: Examined lying and sitting.   Right: Without masses, retractions, nipple discharge or axillary adenopathy.   Left: Without masses, retractions, nipple discharge or axillary adenopathy. Genitourinary   Inguinal/mons:   Normal without inguinal adenopathy  External genitalia:  Normal appearing vulva with no masses, tenderness, or lesions  BUS/Urethra/Skene's glands:  Normal  Vagina:  Atrophic changes  Cervix:  Normal appearing without discharge or lesions  Uterus:  Normal in size, shape and contour.  Midline and mobile, nontender  Adnexa/parametria:     Rt: Normal in size, without masses or tenderness.   Lt: Normal in size, without masses or tenderness.  Anus and perineum: Normal  Digital rectal exam: Normal sphincter tone without palpated masses or tenderness  Patient informed chaperone available to be present for breast and pelvic exam. Patient has requested no chaperone to be present. Patient has been advised what will be completed during breast and pelvic exam.   Assessment/Plan:  [name] for annual exam.   Well female exam with routine gynecological exam - Plan: Cytology - PAP( [facility]). Education provided on SBEs, importance of preventative screenings, current guidelines, high calcium diet, regular exercise, and multivitamin daily. Labs done at Dr. Almetta Lovely office.   Postmenopausal - no bleeding  Menopausal vaginal dryness - Plan: Estradiol 10 MCG TABS vaginal tablet twice weekly with good management.   Dyspareunia, female - Plan: Estradiol 10 MCG TABS vaginal tablet twice weekly.   Screening for cervical cancer - Normal Pap history.  Pap today.   Screening for breast cancer - Normal mammogram history.  Continue annual screenings.  Normal breast exam today.  Screening for colon cancer - 2019 colonoscopy. Will repeat at GI's recommended interval.   Screening for osteoporosis - She is a type 1 diabetic so it is recommended she have Dexa earlier than age 46.  Return in 1 year for annual.     Tamela Gammon DNP, 12:22 PM 03/24/2022

## 2022-03-26 LAB — CYTOLOGY - PAP
Comment: NEGATIVE
Diagnosis: NEGATIVE
High risk HPV: NEGATIVE

## 2022-05-11 ENCOUNTER — Other Ambulatory Visit: Payer: 59

## 2022-05-25 ENCOUNTER — Ambulatory Visit
Admission: RE | Admit: 2022-05-25 | Discharge: 2022-05-25 | Disposition: A | Discharge status: Home / Self Care | Payer: No Typology Code available for payment source | Source: Ambulatory Visit | Attending: Endocrinology | Admitting: Endocrinology

## 2022-05-25 DIAGNOSIS — E049 Nontoxic goiter, unspecified: Secondary | ICD-10-CM

## 2022-08-31 ENCOUNTER — Other Ambulatory Visit: Payer: Self-pay | Admitting: Nurse Practitioner

## 2022-08-31 DIAGNOSIS — N951 Menopausal and female climacteric states: Secondary | ICD-10-CM

## 2022-08-31 DIAGNOSIS — N941 Unspecified dyspareunia: Secondary | ICD-10-CM

## 2022-08-31 NOTE — Telephone Encounter (Signed)
AEX 03/24/22 MMG neg 09/29/2021

## 2022-10-16 ENCOUNTER — Encounter: Payer: Self-pay | Admitting: Nurse Practitioner

## 2022-11-26 ENCOUNTER — Other Ambulatory Visit: Payer: Self-pay | Admitting: Nurse Practitioner

## 2022-11-26 DIAGNOSIS — N941 Unspecified dyspareunia: Secondary | ICD-10-CM

## 2022-11-26 DIAGNOSIS — N951 Menopausal and female climacteric states: Secondary | ICD-10-CM

## 2022-11-26 NOTE — Telephone Encounter (Signed)
Med refill request: estradiol Last AEX: 03/24/22 Next AEX: 03/30/23 Last MMG (if hormonal med): 10/15/22 Refill authorized: Please Advise?

## 2023-03-30 ENCOUNTER — Ambulatory Visit: Payer: Managed Care, Other (non HMO) | Admitting: Nurse Practitioner

## 2023-03-30 NOTE — Progress Notes (Deleted)
Angela Rios 1965-07-16 [id number]   History:  57 y.o. [id number] presents for annual exam without GYN complaints. Postmenopausal. Using vaginal estrogen twice weekly for painful intercourse and vaginal dryness with good management. Normal pap history. T1DM, thyroid nodules managed by endocrinology, has insulin pump. She gets thyroid ultrasound every 2 years.   Gynecologic History Patient's last menstrual period was 10/14/2017.   Contraception/Family planning: post menopausal status Sexually active: Yes  Health Maintenance Last Pap: 03/24/2022. Results were: Normal neg HPV, 5-year repeat Last mammogram: 10/15/2022. Results were: Normal Last colonoscopy: 2019 Last Dexa: Not indicated  Past medical history, past surgical history, family history and social history were all reviewed and documented in the EPIC chart. 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   ROS:  A ROS was performed and pertinent positives and negatives are included.  Exam:  There were no vitals filed for this visit.   There is no height or weight on file to calculate BMI.  General appearance:  Normal Thyroid:  Symmetrical, normal in size, without palpable masses or nodularity. Respiratory  Auscultation:  Clear without wheezing or rhonchi Cardiovascular  Auscultation:  Regular rate, without rubs, murmurs or gallops  Edema/varicosities:  Not grossly evident Abdominal  Soft,nontender, without masses, guarding or rebound.  Liver/spleen:  No organomegaly noted  Hernia:  None appreciated  Skin  Inspection:  Grossly normal Breasts: Examined lying and sitting.   Right: Without masses, retractions, nipple discharge or axillary adenopathy.   Left: Without masses, retractions, nipple discharge or axillary adenopathy. Pelvic: External genitalia:  no lesions               Urethra:  normal appearing urethra with no masses, tenderness or lesions              Bartholins and Skenes: normal                 Vagina: normal appearing vagina with normal color and discharge, no lesions              Cervix: no lesions Bimanual Exam:  Uterus:  no masses or tenderness              Adnexa: no mass, fullness, tenderness              Rectovaginal: Deferred              Anus:  normal, no lesions   Patient informed chaperone available to be present for breast and pelvic exam. Patient has requested no chaperone to be present. Patient has been advised what will be completed during breast and pelvic exam.   Assessment/Plan:  57 y.o. [id number] for annual exam.   Well female exam with routine gynecological exam - Education provided on SBEs, importance of preventative screenings, current guidelines, high calcium diet, regular exercise, and multivitamin daily. Labs done at Dr. Willeen Cass office.   Postmenopausal - no bleeding  Menopausal vaginal dryness - Plan: Estradiol 10 MCG TABS vaginal tablet twice weekly with good management.   Screening for cervical cancer - Normal Pap history.  Will repeat at 5-year interval per guidelines.   Screening for breast cancer - Normal mammogram history.  Continue annual screenings.  Normal breast exam today.  Screening for colon cancer - 2019 colonoscopy. Will repeat at GI's recommended interval.   Screening for osteoporosis - She is a type 1  diabetic so it is recommended she have Dexa earlier than age 3.  Return in 1 year for annual.     Olivia Mackie DNP, 7:41 AM 03/30/2023

## 2023-05-17 ENCOUNTER — Other Ambulatory Visit: Payer: Self-pay

## 2023-05-17 ENCOUNTER — Other Ambulatory Visit: Payer: Self-pay | Admitting: Endocrinology

## 2023-05-17 DIAGNOSIS — N951 Menopausal and female climacteric states: Secondary | ICD-10-CM

## 2023-05-17 DIAGNOSIS — N941 Unspecified dyspareunia: Secondary | ICD-10-CM

## 2023-05-17 DIAGNOSIS — E049 Nontoxic goiter, unspecified: Secondary | ICD-10-CM

## 2023-05-17 MED ORDER — ESTRADIOL 10 MCG VA TABS: ORAL_TABLET | VAGINAL | 0 refills | Status: DC

## 2023-05-17 NOTE — Telephone Encounter (Signed)
Advanced Refill Approval Request Med refill request: Vagifem Last AEX: 03/24/2022-TW Next AEX: 07/29/2023 Last MMG (if hormonal med) 10/15/2022-WNL Refill authorized: Rx pend.

## 2023-05-26 ENCOUNTER — Ambulatory Visit
Admission: RE | Admit: 2023-05-26 | Discharge: 2023-05-26 | Disposition: A | Discharge status: Home / Self Care | Payer: 59 | Source: Ambulatory Visit | Attending: Endocrinology | Admitting: Endocrinology

## 2023-05-26 ENCOUNTER — Other Ambulatory Visit: Payer: 59

## 2023-05-26 DIAGNOSIS — E049 Nontoxic goiter, unspecified: Secondary | ICD-10-CM

## 2023-07-29 ENCOUNTER — Encounter: Payer: Self-pay | Admitting: Nurse Practitioner

## 2023-07-29 ENCOUNTER — Ambulatory Visit (INDEPENDENT_AMBULATORY_CARE_PROVIDER_SITE_OTHER): Payer: Commercial Managed Care - PPO | Admitting: Nurse Practitioner

## 2023-07-29 VITALS — BP 140/78 | HR 71 | Ht 62.6 in | Wt 160.0 lb

## 2023-07-29 DIAGNOSIS — N951 Menopausal and female climacteric states: Secondary | ICD-10-CM | POA: Diagnosis not present

## 2023-07-29 DIAGNOSIS — N941 Unspecified dyspareunia: Secondary | ICD-10-CM | POA: Diagnosis not present

## 2023-07-29 DIAGNOSIS — Z780 Asymptomatic menopausal state: Secondary | ICD-10-CM

## 2023-07-29 DIAGNOSIS — Z01419 Encounter for gynecological examination (general) (routine) without abnormal findings: Secondary | ICD-10-CM | POA: Diagnosis not present

## 2023-07-29 MED ORDER — ESTRADIOL 10 MCG VA TABS
ORAL_TABLET | VAGINAL | 3 refills | Status: AC
Start: 2023-07-29 — End: ?

## 2023-07-29 NOTE — Progress Notes (Signed)
 Angela Rios 04-15-1966 [id number]   History:  58 y.o. [name] presents for annual exam without GYN complaints. Postmenopausal. Using vaginal estrogen twice weekly for painful intercourse and vaginal dryness with good management. Normal pap history. T1DM, thyroid  nodules managed by endocrinology, has insulin pump. She gets thyroid  ultrasound every 2 years, most recent 05/26/23.   Gynecologic History Patient's last menstrual period was 10/14/2017.   Contraception/Family planning: post menopausal status Sexually active: Yes  Health Maintenance Last Pap: 03/24/2022. Results were: Normal neg HPV Last mammogram: 10/15/2022. Results were: Normal Last colonoscopy: 2019 Last Dexa: Not indicated Exercising: Yes. Running, pickleball Smoker: no   Past medical history, past surgical history, family history and social history were all reviewed and documented in the EPIC chart. 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 Angela Rios, art gallery manager.   ROS:  A ROS was performed and pertinent positives and negatives are included.  Exam:  Vitals:   07/29/23 0811  BP: (!) 140/78  Pulse: 71  SpO2: 98%  Weight: 160 lb (72.6 kg)  Height: 5' 2.6 (1.59 m)     Body mass index is 28.71 kg/m.  General appearance:  Normal Thyroid :  Symmetrical, normal in size, without palpable masses or nodularity. Respiratory  Auscultation:  Clear without wheezing or rhonchi Cardiovascular  Auscultation:  Regular rate, without rubs, murmurs or gallops  Edema/varicosities:  Not grossly evident Abdominal  Soft,nontender, without masses, guarding or rebound.  Liver/spleen:  No organomegaly noted  Hernia:  None appreciated  Skin  Inspection:  Grossly normal Breasts: Examined lying and sitting.   Right: Without masses, retractions, nipple discharge or axillary adenopathy.   Left: Without masses,  retractions, nipple discharge or axillary adenopathy. Pelvic: External genitalia:  no lesions              Urethra:  normal appearing urethra with no masses, tenderness or lesions              Bartholins and Skenes: normal                 Vagina: normal appearing vagina with normal color and discharge, no lesions              Cervix: no lesions Bimanual Exam:  Uterus:  no masses or tenderness              Adnexa: no mass, fullness, tenderness              Rectovaginal: Deferred              Anus:  normal, no lesions  Patient informed chaperone available to be present for breast and pelvic exam. Patient has requested no chaperone to be present. Patient has been advised what will be completed during breast and pelvic exam.   Assessment/Plan:  58 y.o. [name] for annual exam.   Well female exam with routine gynecological exam - Education provided on SBEs, importance of preventative screenings, current guidelines, high calcium diet, regular exercise, and multivitamin daily. Labs done at Dr. Becki office.   Postmenopausal - no bleeding  Menopausal vaginal dryness - Plan: Estradiol  10 MCG TABS vaginal tablet twice weekly with good management.   Screening for cervical cancer - Normal Pap history.  Will repeat at 5-year interval per guidelines.   Screening for breast cancer - Normal mammogram history.  Continue annual screenings.  Normal breast exam today.  Screening for colon cancer -  2019 colonoscopy. Will repeat at GI's recommended interval.   Screening for osteoporosis - She is a type 1 diabetic so it is recommended she have Dexa earlier than age 70.  Return in about 1 year (around 07/28/2024) for Annual.    Angela DELENA Shutter DNP, 8:23 AM 07/29/2023

## 2023-10-23 LAB — HEMOGLOBIN A1C: Hemoglobin A1C: 7.3

## 2023-10-25 ENCOUNTER — Encounter: Payer: Self-pay | Admitting: Nurse Practitioner

## 2024-01-07 ENCOUNTER — Encounter: Payer: Self-pay | Admitting: Advanced Practice Midwife

## 2024-08-01 ENCOUNTER — Ambulatory Visit: Payer: 59 | Admitting: Nurse Practitioner

## 2024-08-21 ENCOUNTER — Ambulatory Visit: Admitting: Nurse Practitioner
